# Patient Record
Sex: Male | Born: 1965 | Race: White | Hispanic: No | Marital: Married | Smoking: Current some day smoker
Health system: Southern US, Community
[De-identification: ages and names within clinical notes are randomized; demographics above are authoritative.]

## PROBLEM LIST (undated history)

## (undated) DIAGNOSIS — E669 Obesity, unspecified: Secondary | ICD-10-CM

## (undated) DIAGNOSIS — E7800 Pure hypercholesterolemia, unspecified: Secondary | ICD-10-CM

## (undated) DIAGNOSIS — F4310 Post-traumatic stress disorder, unspecified: Secondary | ICD-10-CM

## (undated) DIAGNOSIS — I10 Essential (primary) hypertension: Secondary | ICD-10-CM

---

## 2005-02-21 ENCOUNTER — Ambulatory Visit (HOSPITAL_COMMUNITY): Admission: RE | Admit: 2005-02-21 | Discharge: 2005-02-21 | Payer: Self-pay | Admitting: Family Medicine

## 2005-03-20 ENCOUNTER — Ambulatory Visit: Payer: Self-pay | Admitting: Internal Medicine

## 2005-03-25 ENCOUNTER — Ambulatory Visit: Payer: Self-pay | Admitting: Internal Medicine

## 2005-03-25 ENCOUNTER — Ambulatory Visit (HOSPITAL_COMMUNITY): Admission: RE | Admit: 2005-03-25 | Discharge: 2005-03-25 | Payer: Self-pay | Admitting: Internal Medicine

## 2005-04-26 ENCOUNTER — Ambulatory Visit: Payer: Self-pay | Admitting: Internal Medicine

## 2005-08-02 ENCOUNTER — Emergency Department (HOSPITAL_COMMUNITY): Admission: EM | Admit: 2005-08-02 | Discharge: 2005-08-02 | Payer: Self-pay | Admitting: Emergency Medicine

## 2005-08-02 ENCOUNTER — Ambulatory Visit: Payer: Self-pay | Admitting: Psychiatry

## 2005-08-02 ENCOUNTER — Inpatient Hospital Stay (HOSPITAL_COMMUNITY): Admission: RE | Admit: 2005-08-02 | Discharge: 2005-08-13 | Payer: Self-pay | Admitting: Psychiatry

## 2005-08-09 ENCOUNTER — Ambulatory Visit (HOSPITAL_COMMUNITY): Admission: RE | Admit: 2005-08-09 | Discharge: 2005-08-09 | Payer: Self-pay | Admitting: Psychiatry

## 2005-09-05 ENCOUNTER — Ambulatory Visit: Payer: Self-pay | Admitting: Psychiatry

## 2005-09-09 ENCOUNTER — Emergency Department (HOSPITAL_COMMUNITY): Admission: EM | Admit: 2005-09-09 | Discharge: 2005-09-10 | Payer: Self-pay | Admitting: Emergency Medicine

## 2005-09-23 ENCOUNTER — Ambulatory Visit (HOSPITAL_COMMUNITY): Admission: RE | Admit: 2005-09-23 | Discharge: 2005-09-23 | Payer: Self-pay | Admitting: Family Medicine

## 2005-10-01 ENCOUNTER — Ambulatory Visit (HOSPITAL_COMMUNITY): Admission: RE | Admit: 2005-10-01 | Discharge: 2005-10-01 | Payer: Self-pay | Admitting: Otolaryngology

## 2005-10-03 ENCOUNTER — Encounter: Payer: Self-pay | Admitting: Otolaryngology

## 2005-10-03 ENCOUNTER — Encounter: Admission: RE | Admit: 2005-10-03 | Discharge: 2005-10-03 | Payer: Self-pay | Admitting: Otolaryngology

## 2005-10-04 ENCOUNTER — Ambulatory Visit (HOSPITAL_COMMUNITY): Admission: RE | Admit: 2005-10-04 | Discharge: 2005-10-04 | Payer: Self-pay | Admitting: Otolaryngology

## 2005-10-08 ENCOUNTER — Ambulatory Visit (HOSPITAL_COMMUNITY): Admission: RE | Admit: 2005-10-08 | Discharge: 2005-10-09 | Payer: Self-pay | Admitting: Otolaryngology

## 2005-10-31 ENCOUNTER — Ambulatory Visit: Payer: Self-pay | Admitting: Psychiatry

## 2005-11-08 ENCOUNTER — Ambulatory Visit (HOSPITAL_COMMUNITY): Admission: RE | Admit: 2005-11-08 | Discharge: 2005-11-08 | Payer: Self-pay | Admitting: Otolaryngology

## 2005-11-11 ENCOUNTER — Emergency Department (HOSPITAL_COMMUNITY): Admission: EM | Admit: 2005-11-11 | Discharge: 2005-11-11 | Payer: Self-pay | Admitting: Emergency Medicine

## 2005-11-24 ENCOUNTER — Inpatient Hospital Stay (HOSPITAL_COMMUNITY): Admission: RE | Admit: 2005-11-24 | Discharge: 2005-12-10 | Payer: Self-pay | Admitting: Psychiatry

## 2005-11-28 ENCOUNTER — Encounter: Payer: Self-pay | Admitting: Emergency Medicine

## 2005-12-31 ENCOUNTER — Ambulatory Visit: Payer: Self-pay | Admitting: Psychiatry

## 2006-01-30 ENCOUNTER — Ambulatory Visit (HOSPITAL_COMMUNITY): Admission: RE | Admit: 2006-01-30 | Discharge: 2006-01-30 | Payer: Self-pay | Admitting: Family Medicine

## 2006-02-11 ENCOUNTER — Emergency Department (HOSPITAL_COMMUNITY): Admission: EM | Admit: 2006-02-11 | Discharge: 2006-02-11 | Payer: Self-pay | Admitting: Emergency Medicine

## 2006-02-12 ENCOUNTER — Encounter: Payer: Self-pay | Admitting: Emergency Medicine

## 2006-02-12 ENCOUNTER — Inpatient Hospital Stay (HOSPITAL_COMMUNITY): Admission: RE | Admit: 2006-02-12 | Discharge: 2006-02-14 | Payer: Self-pay | Admitting: Psychiatry

## 2006-02-13 ENCOUNTER — Ambulatory Visit: Payer: Self-pay | Admitting: Psychiatry

## 2006-03-03 ENCOUNTER — Emergency Department (HOSPITAL_COMMUNITY): Admission: EM | Admit: 2006-03-03 | Discharge: 2006-03-03 | Payer: Self-pay | Admitting: Emergency Medicine
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
## 2006-10-12 IMAGING — CT CT MAXILLOFACIAL W/O CM
1 series · 16 of 30 positions shown, 20 images · IV contrast (agent unspecified)
Comparison: 10/03/05.

CLINICAL DATA: Facial pain and sinusitis ? for surgery.  Stealth protocol.
 MAXILLOFACIAL CT WITHOUT CONTRAST:
TECHNIQUE: Coronal and axial CT images were obtained through the maxillofacial region including the facial bones, orbits, and paranasal sinuses.  No intravenous contrast was administered.  1.25 mm images obtained in the axial plane with Stealth protocol.

[Series 4: stealth · axial · 0.49mm/px · z∈[+80,+265]mm · 16 of 160 slices shown, 20 images]
[im 6/160  brain]
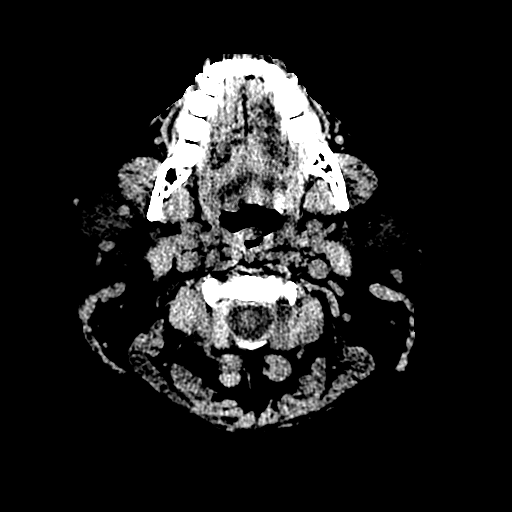
[im 6/160  bone]
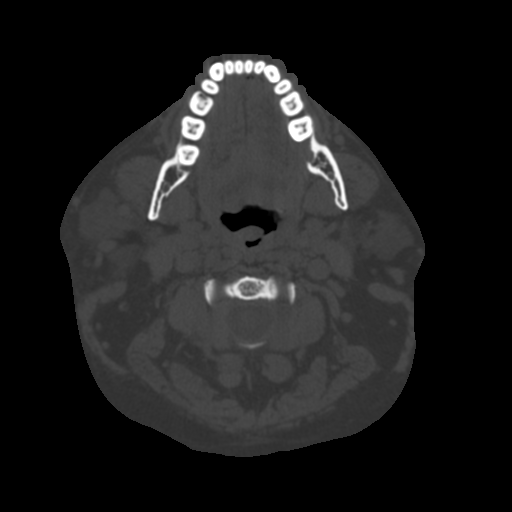
[im 17/160  bone]
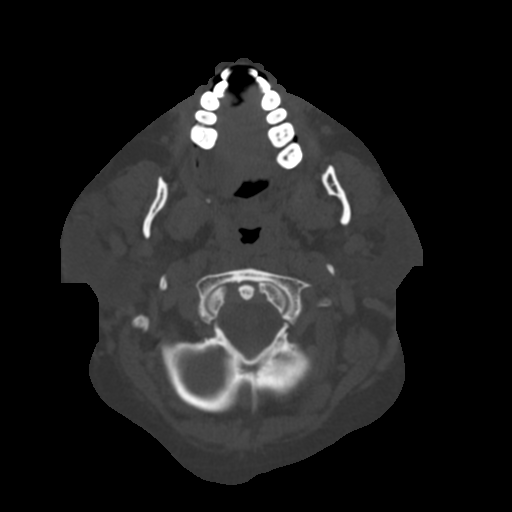
[im 28/160  bone]
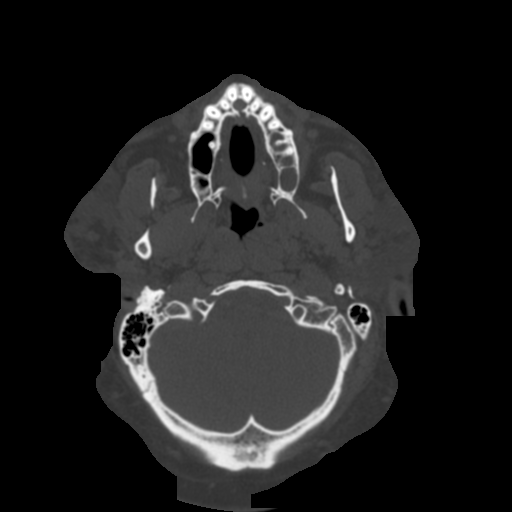
[im 39/160  bone]
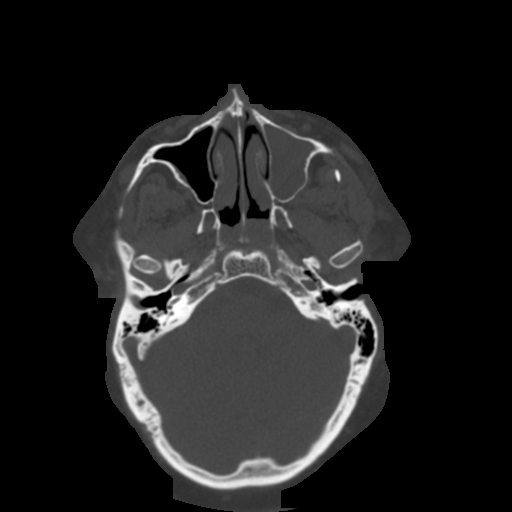
[im 44/160  brain]
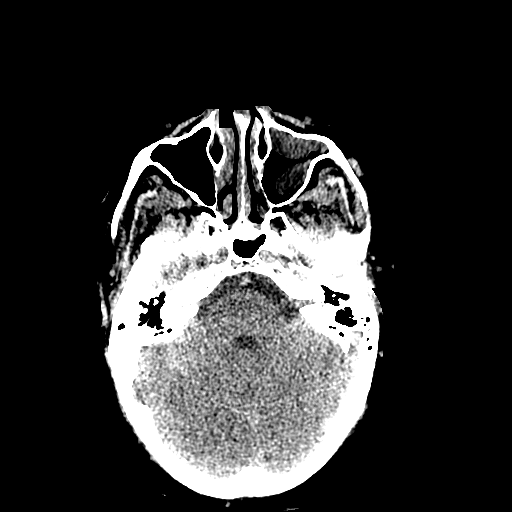
[im 44/160  bone]
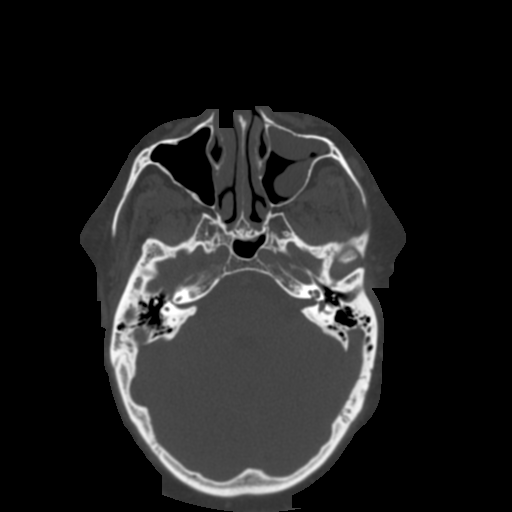
[im 55/160  bone]
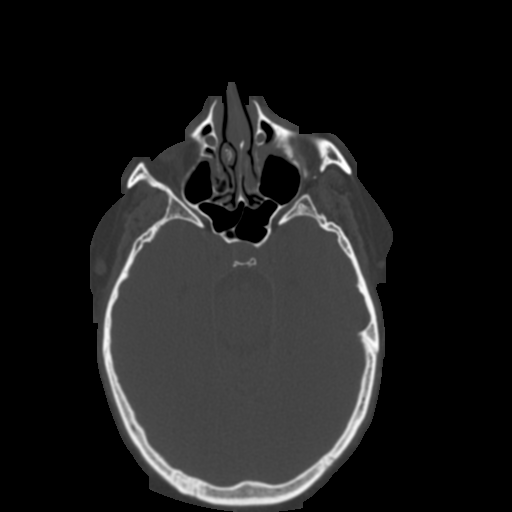
[im 66/160  bone]
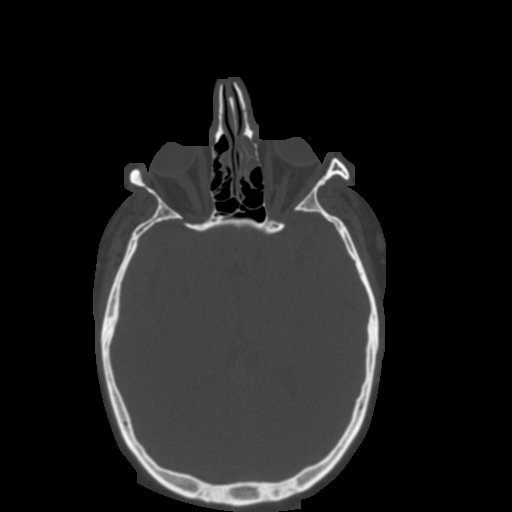
[im 77/160  bone]
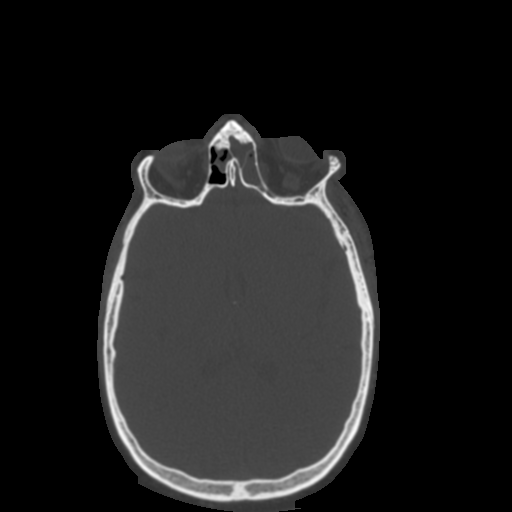
[im 83/160  brain]
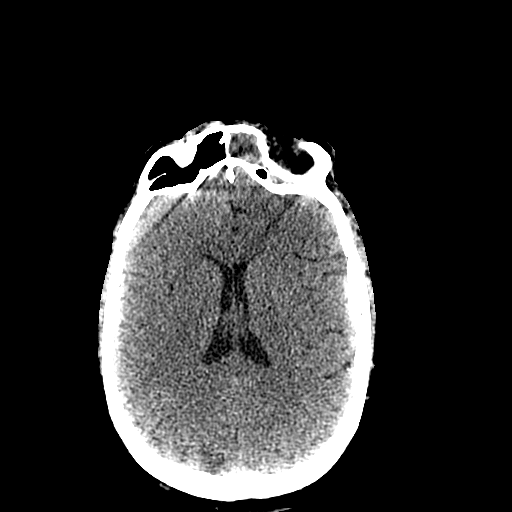
[im 83/160  bone]
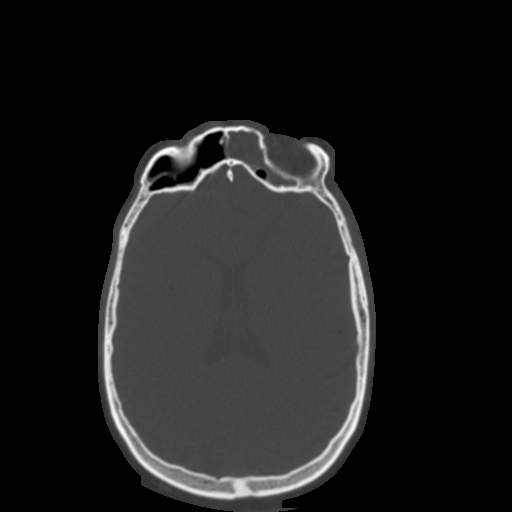
[im 94/160  bone]
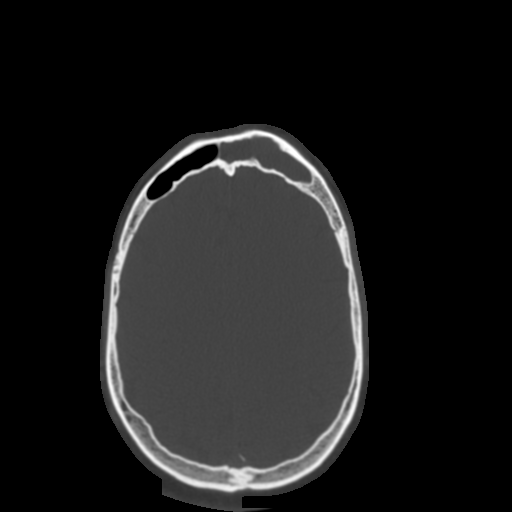
[im 105/160  bone]
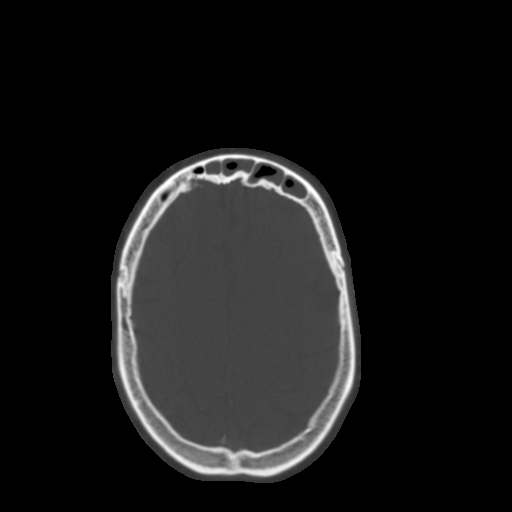
[im 116/160  bone]
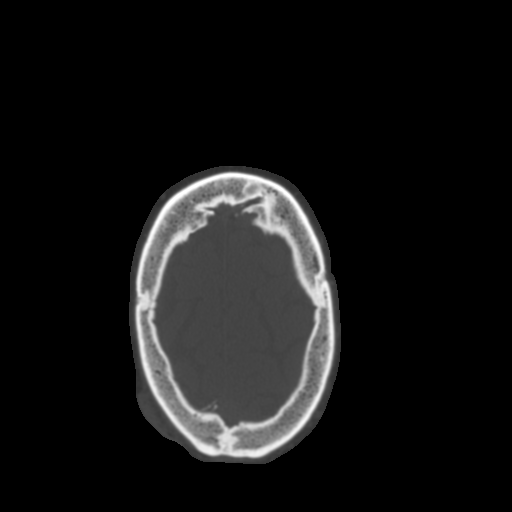
[im 121/160  brain]
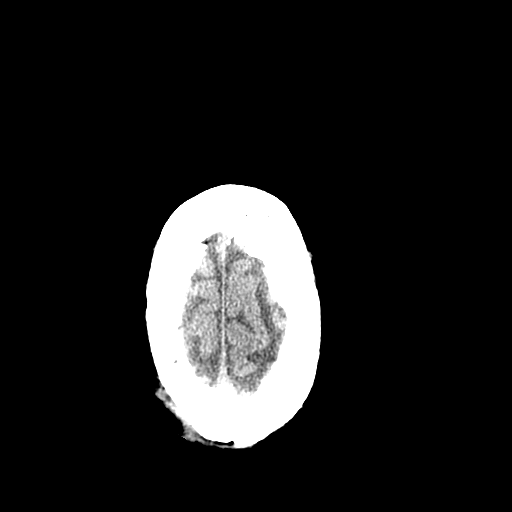
[im 121/160  bone]
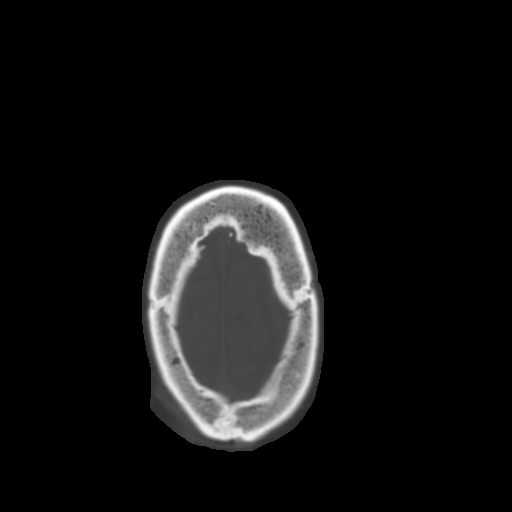
[im 132/160  bone]
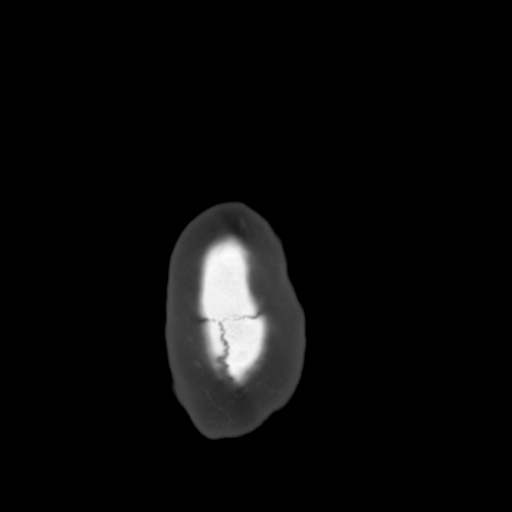
[im 143/160  bone]
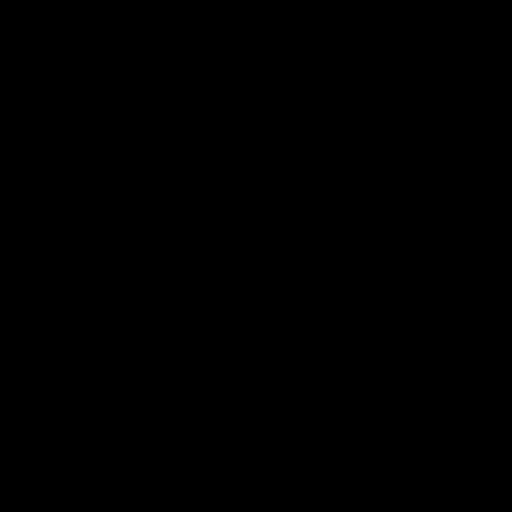
[im 154/160  bone]
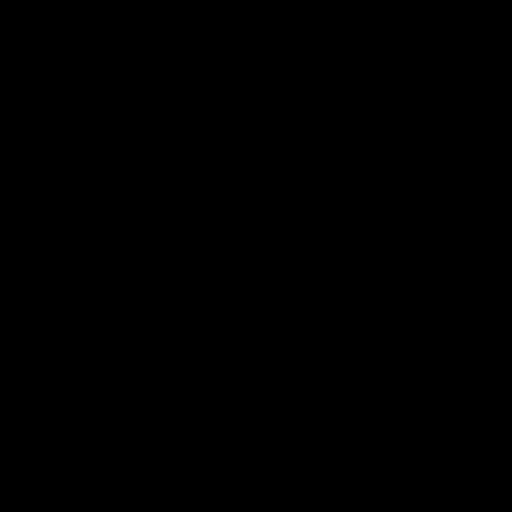

[16 of 30 positions shown; findings below may reference images not displayed]

FINDINGS: Changes of sinusitis are essentially unchanged from yesterday?s exam, with almost complete opacification of the left frontal sinus.  Bilateral ethmoid disease and left maxillary disease appear unchanged.  The sphenoid sinuses appear normally aerated.
IMPRESSION: Sinusitis as described above ? no significant change compared to 10/03/05.

## 2023-02-17 ENCOUNTER — Emergency Department (HOSPITAL_COMMUNITY): Payer: 59

## 2023-02-17 ENCOUNTER — Encounter (HOSPITAL_COMMUNITY): Payer: Self-pay | Admitting: Emergency Medicine

## 2023-02-17 ENCOUNTER — Emergency Department (HOSPITAL_COMMUNITY)
Admission: EM | Admit: 2023-02-17 | Discharge: 2023-02-17 | Disposition: A | Discharge status: Home / Self Care | Payer: 59 | Attending: Emergency Medicine | Admitting: Emergency Medicine

## 2023-02-17 ENCOUNTER — Other Ambulatory Visit: Payer: Self-pay

## 2023-02-17 DIAGNOSIS — R0789 Other chest pain: Secondary | ICD-10-CM | POA: Insufficient documentation

## 2023-02-17 DIAGNOSIS — I10 Essential (primary) hypertension: Secondary | ICD-10-CM | POA: Insufficient documentation

## 2023-02-17 HISTORY — DX: Pure hypercholesterolemia, unspecified: E78.00

## 2023-02-17 HISTORY — DX: Essential (primary) hypertension: I10

## 2023-02-17 HISTORY — DX: Post-traumatic stress disorder, unspecified: F43.10

## 2023-02-17 LAB — CBC
HCT: 33.9 % — ABNORMAL LOW (ref 39.0–52.0)
Hemoglobin: 11.4 g/dL — ABNORMAL LOW (ref 13.0–17.0)
MCH: 32 pg (ref 26.0–34.0)
MCHC: 33.6 g/dL (ref 30.0–36.0)
MCV: 95.2 fL (ref 80.0–100.0)
Platelets: 160 10*3/uL (ref 150–400)
RBC: 3.56 MIL/uL — ABNORMAL LOW (ref 4.22–5.81)
RDW: 13.3 % (ref 11.5–15.5)
WBC: 3.9 10*3/uL — ABNORMAL LOW (ref 4.0–10.5)
nRBC: 0 % (ref 0.0–0.2)

## 2023-02-17 LAB — TROPONIN I (HIGH SENSITIVITY)
Troponin I (High Sensitivity): 6 ng/L (ref <18)
Troponin I (High Sensitivity): 7 ng/L (ref <18)

## 2023-02-17 LAB — COMPREHENSIVE METABOLIC PANEL
ALT: 38 U/L (ref 0–44)
AST: 33 U/L (ref 15–41)
Albumin: 3.6 g/dL (ref 3.5–5.0)
Alkaline Phosphatase: 86 U/L (ref 38–126)
Anion gap: 8 (ref 5–15)
BUN: 16 mg/dL (ref 6–20)
CO2: 24 mmol/L (ref 22–32)
Calcium: 8.5 mg/dL — ABNORMAL LOW (ref 8.9–10.3)
Chloride: 106 mmol/L (ref 98–111)
Creatinine, Ser: 0.95 mg/dL (ref 0.61–1.24)
GFR, Estimated: >60 mL/min (ref >60)
Glucose, Bld: 139 mg/dL — ABNORMAL HIGH (ref 70–99)
Potassium: 3.9 mmol/L (ref 3.5–5.1)
Sodium: 138 mmol/L (ref 135–145)
Total Bilirubin: 0.4 mg/dL (ref 0.3–1.2)
Total Protein: 6.2 g/dL — ABNORMAL LOW (ref 6.5–8.1)

## 2023-02-17 LAB — LIPASE, BLOOD: Lipase: 48 U/L (ref 11–51)

## 2023-02-17 MED ORDER — HYDROCODONE-ACETAMINOPHEN 5-325 MG PO TABS: 2.0000 | ORAL_TABLET | ORAL | 0 refills | Status: DC | PRN

## 2023-02-17 MED ORDER — IOHEXOL 350 MG/ML SOLN
100.0000 mL | Freq: Once | INTRAVENOUS | Status: AC | PRN
  Administered 2023-02-17: 80 mL via INTRAVENOUS

## 2023-02-17 MED ORDER — LORAZEPAM 0.5 MG PO TABS
0.5000 mg | ORAL_TABLET | Freq: Once | ORAL | Status: DC
  Filled 2023-02-17: qty 1

## 2023-02-17 MED ORDER — HYDROMORPHONE HCL 1 MG/ML IJ SOLN
1.0000 mg | Freq: Once | INTRAMUSCULAR | Status: AC
  Administered 2023-02-17: 1 mg via INTRAVENOUS
  Filled 2023-02-17: qty 1

## 2023-02-17 MED ORDER — KETOROLAC TROMETHAMINE 30 MG/ML IJ SOLN
30.0000 mg | Freq: Once | INTRAMUSCULAR | Status: AC
  Administered 2023-02-17: 30 mg via INTRAVENOUS
  Filled 2023-02-17: qty 1

## 2023-02-17 MED ORDER — LORAZEPAM 1 MG PO TABS
1.0000 mg | ORAL_TABLET | Freq: Once | ORAL | Status: AC
  Administered 2023-02-17: 1 mg via ORAL
  Filled 2023-02-17: qty 1
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
## 2023-02-17 NOTE — Discharge Instructions (Addendum)
You were seen in the emergency department today for chest pain.  Thankfully all of your labs and imaging were reassuring without any signs of any rib fractures, cardiac disease, or pulmonary embolisms present.  Although there is no signs of rib fractures, I am going to approach the treatment of this as if it were a rib fracture and sent home with an incentive spirometer as well as some short-term pain control medication to hopefully ease the discomfort and reduce the risk of any worsening of your pain.  Please come back to the emergency department through breathing becomes significantly more labored or if you begin to develop a fever.

## 2023-02-17 NOTE — ED Triage Notes (Signed)
Reports about 3 weeks ago was lifting unbalanced things and started having right sided chest pain.  Patient reports over last 4 days it has got worse and it wraps around upper abd and in to back.  Reports pain with inspiration.

## 2023-02-25 ENCOUNTER — Other Ambulatory Visit: Payer: Self-pay

## 2023-02-25 ENCOUNTER — Encounter (HOSPITAL_COMMUNITY): Payer: Self-pay

## 2023-02-25 ENCOUNTER — Observation Stay (HOSPITAL_COMMUNITY)
Admission: EM | Admit: 2023-02-25 | Discharge: 2023-02-27 | Disposition: A | Discharge status: Home / Self Care | Payer: 59 | Attending: Family Medicine | Admitting: Family Medicine

## 2023-02-25 ENCOUNTER — Emergency Department (HOSPITAL_COMMUNITY): Payer: 59

## 2023-02-25 DIAGNOSIS — R739 Hyperglycemia, unspecified: Secondary | ICD-10-CM | POA: Diagnosis not present

## 2023-02-25 DIAGNOSIS — R9431 Abnormal electrocardiogram [ECG] [EKG]: Secondary | ICD-10-CM | POA: Diagnosis not present

## 2023-02-25 DIAGNOSIS — I480 Paroxysmal atrial fibrillation: Secondary | ICD-10-CM | POA: Diagnosis present

## 2023-02-25 DIAGNOSIS — Z79899 Other long term (current) drug therapy: Secondary | ICD-10-CM | POA: Insufficient documentation

## 2023-02-25 DIAGNOSIS — E785 Hyperlipidemia, unspecified: Secondary | ICD-10-CM | POA: Diagnosis not present

## 2023-02-25 DIAGNOSIS — Z7982 Long term (current) use of aspirin: Secondary | ICD-10-CM | POA: Insufficient documentation

## 2023-02-25 DIAGNOSIS — F17210 Nicotine dependence, cigarettes, uncomplicated: Secondary | ICD-10-CM | POA: Diagnosis not present

## 2023-02-25 DIAGNOSIS — R079 Chest pain, unspecified: Secondary | ICD-10-CM | POA: Diagnosis present

## 2023-02-25 DIAGNOSIS — F4310 Post-traumatic stress disorder, unspecified: Secondary | ICD-10-CM | POA: Insufficient documentation

## 2023-02-25 DIAGNOSIS — R0602 Shortness of breath: Secondary | ICD-10-CM | POA: Insufficient documentation

## 2023-02-25 DIAGNOSIS — R0789 Other chest pain: Secondary | ICD-10-CM | POA: Diagnosis not present

## 2023-02-25 DIAGNOSIS — I10 Essential (primary) hypertension: Secondary | ICD-10-CM | POA: Diagnosis not present

## 2023-02-25 DIAGNOSIS — G8929 Other chronic pain: Secondary | ICD-10-CM | POA: Diagnosis not present

## 2023-02-25 LAB — URINALYSIS, ROUTINE W REFLEX MICROSCOPIC
Bilirubin Urine: NEGATIVE
Glucose, UA: NEGATIVE mg/dL
Hgb urine dipstick: NEGATIVE
Ketones, ur: NEGATIVE mg/dL
Leukocytes,Ua: NEGATIVE
Nitrite: NEGATIVE
Protein, ur: 30 mg/dL — AB
Specific Gravity, Urine: 1.023 (ref 1.005–1.030)
pH: 5 (ref 5.0–8.0)

## 2023-02-25 LAB — CBC
HCT: 39.3 % (ref 39.0–52.0)
Hemoglobin: 13 g/dL (ref 13.0–17.0)
MCH: 31.9 pg (ref 26.0–34.0)
MCHC: 33.1 g/dL (ref 30.0–36.0)
MCV: 96.3 fL (ref 80.0–100.0)
Platelets: 192 10*3/uL (ref 150–400)
RBC: 4.08 MIL/uL — ABNORMAL LOW (ref 4.22–5.81)
RDW: 13.6 % (ref 11.5–15.5)
WBC: 5.1 10*3/uL (ref 4.0–10.5)
nRBC: 0 % (ref 0.0–0.2)

## 2023-02-25 LAB — BASIC METABOLIC PANEL
Anion gap: 11 (ref 5–15)
BUN: 14 mg/dL (ref 6–20)
CO2: 26 mmol/L (ref 22–32)
Calcium: 9.1 mg/dL (ref 8.9–10.3)
Chloride: 96 mmol/L — ABNORMAL LOW (ref 98–111)
Creatinine, Ser: 1 mg/dL (ref 0.61–1.24)
GFR, Estimated: >60 mL/min (ref >60)
Glucose, Bld: 241 mg/dL — ABNORMAL HIGH (ref 70–99)
Potassium: 4.1 mmol/L (ref 3.5–5.1)
Sodium: 133 mmol/L — ABNORMAL LOW (ref 135–145)

## 2023-02-25 LAB — BRAIN NATRIURETIC PEPTIDE: B Natriuretic Peptide: 19 pg/mL (ref 0.0–100.0)

## 2023-02-25 LAB — TROPONIN I (HIGH SENSITIVITY)
Troponin I (High Sensitivity): 7 ng/L (ref <18)
Troponin I (High Sensitivity): 6 ng/L (ref <18)

## 2023-02-25 MED ORDER — LURASIDONE HCL 40 MG PO TABS
20.0000 mg | ORAL_TABLET | Freq: Every day | ORAL | Status: DC
  Administered 2023-02-26 – 2023-02-27 (×2): 20 mg via ORAL
  Filled 2023-02-25 (×2): qty 1

## 2023-02-25 MED ORDER — AMLODIPINE BESYLATE 5 MG PO TABS
5.0000 mg | ORAL_TABLET | Freq: Every day | ORAL | Status: DC
  Administered 2023-02-26 – 2023-02-27 (×2): 5 mg via ORAL
  Filled 2023-02-25 (×2): qty 1

## 2023-02-25 MED ORDER — HYDROMORPHONE HCL 1 MG/ML IJ SOLN
1.0000 mg | Freq: Once | INTRAMUSCULAR | Status: AC
  Administered 2023-02-25: 1 mg via INTRAVENOUS
  Filled 2023-02-25: qty 1

## 2023-02-25 MED ORDER — HYDRALAZINE HCL 20 MG/ML IJ SOLN
10.0000 mg | Freq: Once | INTRAMUSCULAR | Status: AC
  Administered 2023-02-25: 10 mg via INTRAVENOUS
  Filled 2023-02-25: qty 1

## 2023-02-25 MED ORDER — ASPIRIN 81 MG PO CHEW
324.0000 mg | CHEWABLE_TABLET | Freq: Once | ORAL | Status: AC
  Administered 2023-02-25: 324 mg via ORAL
  Filled 2023-02-25: qty 4

## 2023-02-25 MED ORDER — ROSUVASTATIN CALCIUM 10 MG PO TABS
10.0000 mg | ORAL_TABLET | Freq: Every day | ORAL | Status: DC
  Administered 2023-02-26 – 2023-02-27 (×2): 10 mg via ORAL
  Filled 2023-02-25 (×2): qty 1

## 2023-02-25 MED ORDER — HEPARIN SODIUM (PORCINE) 5000 UNIT/ML IJ SOLN
5000.0000 [IU] | Freq: Three times a day (TID) | INTRAMUSCULAR | Status: DC
  Administered 2023-02-25 – 2023-02-27 (×5): 5000 [IU] via SUBCUTANEOUS
  Filled 2023-02-25 (×5): qty 1

## 2023-02-25 MED ORDER — KETOROLAC TROMETHAMINE 15 MG/ML IJ SOLN
15.0000 mg | Freq: Once | INTRAMUSCULAR | Status: AC
  Administered 2023-02-25: 15 mg via INTRAVENOUS
  Filled 2023-02-25: qty 1

## 2023-02-25 MED ORDER — VENLAFAXINE HCL ER 75 MG PO CP24
150.0000 mg | ORAL_CAPSULE | Freq: Every day | ORAL | Status: DC
  Administered 2023-02-26 – 2023-02-27 (×2): 150 mg via ORAL
  Filled 2023-02-25 (×2): qty 2

## 2023-02-25 MED ORDER — ACETAMINOPHEN 325 MG PO TABS
650.0000 mg | ORAL_TABLET | ORAL | Status: DC | PRN
  Administered 2023-02-26: 650 mg via ORAL
  Filled 2023-02-25: qty 2

## 2023-02-25 MED ORDER — GABAPENTIN 100 MG PO CAPS
200.0000 mg | ORAL_CAPSULE | Freq: Two times a day (BID) | ORAL | Status: DC
  Administered 2023-02-25 – 2023-02-26 (×3): 200 mg via ORAL
  Filled 2023-02-25 (×3): qty 2

## 2023-02-25 MED ORDER — ONDANSETRON HCL 4 MG/2ML IJ SOLN: 4.0000 mg | Freq: Four times a day (QID) | INTRAMUSCULAR | Status: DC | PRN

## 2023-02-25 MED ORDER — MORPHINE SULFATE (PF) 4 MG/ML IV SOLN
4.0000 mg | INTRAVENOUS | Status: DC | PRN
  Administered 2023-02-25 – 2023-02-27 (×10): 4 mg via INTRAVENOUS
  Filled 2023-02-25 (×10): qty 1

## 2023-02-25 NOTE — ED Notes (Signed)
ED Provider at bedside. 

## 2023-02-25 NOTE — H&P (Signed)
History and Physical    Patient: Chad Lester [id number] DOB: Jan 04, 1966 DOA: 02/25/2023 DOS: the patient was seen and examined on 02/25/2023 PCP: Pcp, No  Patient coming from: Home  Chief Complaint:  Chief Complaint  Patient presents with   Chest Pain   HPI: Chad Lester is a 57 y.o. male with medical history significant of hyperlipidemia, hypertension, PTSD, mood disorder, presents the ED with a chief complaint of chest pain.  Patient reports that the pain feels like torn muscles.  He reports it feels like a broken rib only more intense.  It started 4 weeks ago.  He was loading a truck with heavy boxes when he became unbalanced and tried to catch himself.  The pain has been present since then.  He took a few days of rest but the pain continued to get worse.  At that time it was moving from the right all the way over to the left.  1 week ago he was seen in the ER and given hydrocodone.  His pain on the right started to improve, but the pain on the left continue to get worse.  Any exertion makes it worse.  The pain "takes his breath away. "  It is still there at rest sometimes.  It wakes him out of sleep sometimes.  He has associated palpitations but reports that he has a history of A-fib, and occasionally gets palpitations.  He reports he had ablation years ago.  He has not had to be on a blood thinner for atrial fibrillation in 7 years per his report.  Patient has never had a stress test.  He has not seen a cardiologist in years.  He feels anxious and wants Dilaudid to help with that.  He has been informed that I do not start without start on narcotics and he has a pain scale that includes Tylenol, oxycodone, and morphine.  Patient reports he has a history of DVT.  It was provoked from a drive from [place] to [place].  He has recently been making multiple trips from [place] to [place].  At his last visit to the ER he had a CTA that was negative for pulmonary embolus.  He has  not made any long drive since then.  Patient reports his dyspnea on exertion has been present for about a year.  It does seem worse with this pain.  Patient has no other complaints.  Patient vapes, and occasionally smokes marijuana.  He does not drink alcohol.  He is vaccinated for COVID and flu.  Patient is full code. Review of Systems: As mentioned in the history of present illness. All other systems reviewed and are negative. Past Medical History:  Diagnosis Date   High cholesterol    Hypertension    PTSD (post-traumatic stress disorder)    History reviewed. No pertinent surgical history. Social History:  reports that he has been smoking cigarettes. He has never used smokeless tobacco. He reports that he does not currently use alcohol. He reports current drug use. Drug: Marijuana.  Allergies  Allergen Reactions   Bentyl [Dicyclomine]    Lisinopril    Penicillins    Sulfa Antibiotics     History reviewed. No pertinent family history.  Prior to Admission medications   Medication Sig Start Date End Date Taking? Authorizing Provider  acetaminophen (TYLENOL) 500 MG tablet Take 500 mg by mouth every 6 (six) hours as needed.   Yes [provider]  amLODipine (NORVASC) 5 MG tablet Take 1  tablet by mouth daily. 05/20/22  Yes [provider]  Aspirin-Salicylamide-Caffeine (ARTHRITIS STRENGTH BC POWDER PO) Take 2 packets by mouth as needed (pain).   Yes [provider]  cyclobenzaprine (FLEXERIL) 10 MG tablet Take 10 mg by mouth as needed for muscle spasms.   Yes [provider]  gabapentin (NEURONTIN) 100 MG capsule Take 2 capsules by mouth 2 (two) times daily.   Yes [provider]  HYDROcodone-acetaminophen (NORCO/VICODIN) 5-325 MG tablet Take 2 tablets by mouth every 4 (four) hours as needed. 02/17/23  Yes Luvenia Heller, PA-C  ibuprofen (ADVIL) 200 MG tablet Take 800 mg by mouth every 6 (six) hours as needed for moderate pain.   Yes [provider]  lurasidone (LATUDA) 20 MG TABS tablet Take 1 tablet by mouth daily.   Yes [provider]  rosuvastatin (CRESTOR) 10 MG tablet Take 10 mg by mouth daily.   Yes [provider]  venlafaxine XR (EFFEXOR-XR) 150 MG 24 hr capsule Take 1 capsule by mouth daily.   Yes [provider]  EPINEPHrine (EPIPEN 2-PAK) 0.3 mg/0.3 mL IJ SOAJ injection Take 1 auto by injection route as directed. Patient not taking: Reported on 02/25/2023 02/08/22   [provider]    Physical Exam: Vitals:   02/25/23 1930 02/25/23 1938 02/25/23 1939 02/25/23 2031  BP:  (!) 139/99  90/78  Pulse: 81 94 91 90  Resp: '16 16 18 20  '$ Temp:    97.7 F (36.5 C)  TempSrc:    Oral  SpO2: 100% 92% 99% 98%  Weight:      Height:       1.  General: Patient lying supine in bed,  no acute distress   2. Psychiatric: Alert and oriented x 3, mood and behavior normal for situation, pleasant and cooperative with exam   3. Neurologic: Speech and language are normal, face is symmetric, moves all 4 extremities voluntarily, at baseline without acute deficits on limited exam   4. HEENMT:  Head is atraumatic, normocephalic, pupils reactive to light, neck is supple, trachea is midline, mucous membranes are moist   5. Respiratory : Lungs are clear to auscultation bilaterally without wheezing, rhonchi, rales, no cyanosis, no increase in work of breathing or accessory muscle use   6. Cardiovascular : Heart rate normal, rhythm is regular, no murmurs, rubs or gallops, no peripheral edema, peripheral pulses palpated   7. Gastrointestinal:  Abdomen is soft, nondistended, nontender to palpation bowel sounds active, no masses or organomegaly palpated   8. Skin:  Skin is warm, dry and intact without rashes, acute lesions, or ulcers on limited exam   9.Musculoskeletal:  No acute deformities or trauma, no asymmetry in tone, no peripheral edema, peripheral pulses palpated, no tenderness to  palpation in the extremities  Data Reviewed: In the ED Temp 98.3, heart rate 81-93, respiratory rate 18-22, blood pressure 126/72-157/110, satting 98% No leukocytosis, hemoglobin 13.0, platelets 192 Chemistry is unremarkable Glucose elevated 241 Chest x-ray shows cardiomegaly without acute abnormality of the lungs EKG shows new Q waves in the inferior leads Cardiology consulted recommended admission for cardiology to round on in the a.m. and possibly for stress test Troponin normal x 2 Admission requested for chest pain observation  Assessment and Plan: * Chest pain - Chest pain wrapping all the way around chest, below the level of T4, with associated dyspnea - Troponin 7, 6 - Patient has been seen several times in the ER for the same complaint, and the  comparison of EKG shows new Q waves - Cardiology consulted recommended admission for cardiology to round on in the a.m. and possibly do stress test - Monitor on telemetry - Trend troponin in the a.m. again - Chest x-ray showed cardiomegaly without acute abnormality - Patient was given aspirin - Defer further cardiac studies to cardiology - Continue to monitor  Hyperglycemia No reported history of diabetes Check hemoglobin A1c in the a.m.  PTSD (post-traumatic stress disorder) - Continue Effexor  Hyperlipidemia - Continue Crestor  Chronic pain - Continue gabapentin 200 mg twice daily  Essential hypertension - Continue Norvasc 5 mg - 1 dose of hydralazine 10 mg IV was also given in the ED for your diastolic blood pressure [id number]      Advance Care Planning:   Code Status: Full Code  Consults: Cardiology  Family Communication: Cousin at bedside  Severity of Illness: The appropriate patient status for this patient is OBSERVATION. Observation status is judged to be reasonable and necessary in order to provide the required intensity of service to ensure the patient's safety. The patient's presenting symptoms, physical exam  findings, and initial radiographic and laboratory data in the context of their medical condition is felt to place them at decreased risk for further clinical deterioration. Furthermore, it is anticipated that the patient will be medically stable for discharge from the hospital within 2 midnights of admission.   Author: Rolla Plate, DO 02/25/2023 8:54 PM  For on call review [email or web address].

## 2023-02-25 NOTE — Assessment & Plan Note (Signed)
-   Continue Norvasc 5 mg - 1 dose of hydralazine 10 mg IV was also given in the ED for your diastolic blood pressure [id number] -Blood pressure stable

## 2023-02-25 NOTE — Assessment & Plan Note (Signed)
-   Continue Effexor

## 2023-02-25 NOTE — Assessment & Plan Note (Signed)
No reported history of diabetes Check hemoglobin A1c in the a.m.

## 2023-02-25 NOTE — Assessment & Plan Note (Signed)
-   Continue gabapentin 200 mg twice daily -Factorial chest pain, likely musculoskeletal -Will consider anti-inflammatories including Robaxin muscle relaxants added

## 2023-02-25 NOTE — ED Notes (Signed)
Called to give report, was told by secretary that they are not taking anyone at this moment.  ED CN notified

## 2023-02-25 NOTE — ED Provider Triage Note (Signed)
Emergency Medicine Provider Triage Evaluation Note  Chad Lester , a 57 y.o. male  was evaluated in triage.  Pt complains of left sided chest pain Pt reports pain takes his breath away   Review of Systems  Positive: Pain with exertion  Negative: fever  Physical Exam  BP (!) 157/106 (BP Location: Right Arm)   Pulse 93   Temp 98.3 F (36.8 C) (Oral)   Resp (!) 22   Ht '5\' 11"'$  (1.803 m)   Wt (!) 154.2 kg   SpO2 97%   BMI 47.42 kg/m  Gen:   Awake, no distress   Resp:  Normal effort  MSK:   Moves extremities without difficulty  Other:    Medical Decision Making  Medically screening exam initiated at 2:16 PM.  Appropriate orders placed.  Chad Lester was informed that the remainder of the evaluation will be completed by another provider, this initial triage assessment does not replace that evaluation, and the importance of remaining in the ED until their evaluation is complete.     Chad Lester, [place] 02/25/23 1418

## 2023-02-25 NOTE — ED Triage Notes (Addendum)
Reports left chest/shoulder pain that is taking his breath away. Patient was seen here on the 19th for chest pain all across his chest and had CTA which was negative.  Patient returns this morning with similar symptoms from previous that have still been going on but just has gotten worse.  Symptoms x 5 weeks. Reports if he lifts his left arm it feels better or if he puts a towel under his armpit the burning gets better.

## 2023-02-25 NOTE — Assessment & Plan Note (Signed)
Continue Crestor 

## 2023-02-25 NOTE — Assessment & Plan Note (Addendum)
-   Atypical chest pain - Left-sided pectoralis - chest tenderness - Chest pain wrapping all the way around chest, below the level of T4, with associated dyspnea - Troponin 7, 6, 10, 9 - Patient has been seen several times in the ER for the same complaint, and the comparison of EKG shows ? new Q waves - Cardiology consulted, evaluated the patient, pending 2D echocardiogram After echo normal can be discharged home, if abnormal consider cardiac cath   - Monitor on telemetry - Trend troponin in the a.m. again - Chest x-ray showed cardiomegaly without acute abnormality - Patient was given aspirin - Defer further cardiac studies to cardiology - Continue to monitor

## 2023-02-25 NOTE — ED Provider Notes (Signed)
[facility] Provider Note   CSN: [id number] Arrival date & time: 02/25/23  1316     History  Chief Complaint  Patient presents with   Chest Pain    Chad Lester is a 57 y.o. male.  HPI 57 year old male with a history of HTN as well as some mild hypercholesterolemia presents with chest pain.  He is been dealing with chest pain for about 5 weeks.  Started when he was moving something and he assumed that he strained his chest wall.  It is primarily been across his anterior chest but over the last 3 days or so it has been primarily left chest.  Any movement such as sitting up or walking will make it hurt.  He does not think the exertion itself makes it hurt.  It will take his breath away.  He was seen here for this about a week ago and had a negative workup including CTA and troponins.  He was put on hydrocodone but this has not helped.  He took some leftover morphine, NSAIDs, BC powders and nothing has helped.  Home Medications Prior to Admission medications   Medication Sig Start Date End Date Taking? Authorizing Provider  acetaminophen (TYLENOL) 500 MG tablet Take 500 mg by mouth every 6 (six) hours as needed.   Yes [provider]  amLODipine (NORVASC) 5 MG tablet Take 1 tablet by mouth daily. 05/20/22  Yes [provider]  Aspirin-Salicylamide-Caffeine (ARTHRITIS STRENGTH BC POWDER PO) Take 2 packets by mouth as needed (pain).   Yes [provider]  cyclobenzaprine (FLEXERIL) 10 MG tablet Take 10 mg by mouth as needed for muscle spasms.   Yes [provider]  gabapentin (NEURONTIN) 100 MG capsule Take 2 capsules by mouth 2 (two) times daily.   Yes [provider]  HYDROcodone-acetaminophen (NORCO/VICODIN) 5-325 MG tablet Take 2 tablets by mouth every 4 (four) hours as needed. 02/17/23  Yes Luvenia Heller, PA-C  ibuprofen (ADVIL) 200 MG tablet Take 800 mg by mouth every 6 (six) hours as needed  for moderate pain.   Yes [provider]  lurasidone (LATUDA) 20 MG TABS tablet Take 1 tablet by mouth daily.   Yes [provider]  rosuvastatin (CRESTOR) 10 MG tablet Take 10 mg by mouth daily.   Yes [provider]  venlafaxine XR (EFFEXOR-XR) 150 MG 24 hr capsule Take 1 capsule by mouth daily.   Yes [provider]  EPINEPHrine (EPIPEN 2-PAK) 0.3 mg/0.3 mL IJ SOAJ injection Take 1 auto by injection route as directed. Patient not taking: Reported on 02/25/2023 02/08/22   [provider]      Allergies    Bentyl [dicyclomine], Lisinopril, Penicillins, and Sulfa antibiotics    Review of Systems   Review of Systems  Cardiovascular:  Positive for chest pain.  Gastrointestinal:  Negative for abdominal pain.    Physical Exam Updated Vital Signs BP (!) 162/109   Pulse 81   Temp 98 F (36.7 C) (Oral)   Resp 16   Ht '5\' 11"'$  (1.803 m)   Wt (!) 156.5 kg   SpO2 100%   BMI 48.12 kg/m  Physical Exam Vitals and nursing note reviewed.  Constitutional:      Appearance: He is well-developed. He is obese. He is not ill-appearing or diaphoretic.  HENT:     Head: Normocephalic and atraumatic.  Cardiovascular:     Rate and Rhythm: Normal rate and regular rhythm.  Pulses:          Radial pulses are 2+ on the left side.     Heart sounds: Normal heart sounds.  Pulmonary:     Effort: Pulmonary effort is normal.     Breath sounds: Normal breath sounds.  Chest:     Chest wall: No tenderness.  Abdominal:     Palpations: Abdomen is soft.     Tenderness: There is no abdominal tenderness.  Skin:    General: Skin is warm and dry.  Neurological:     Mental Status: He is alert.     ED Results / Procedures / Treatments   Labs (all labs ordered are listed, but only abnormal results are displayed) Labs Reviewed  BASIC METABOLIC PANEL - Abnormal; Notable for the following components:      Result Value   Sodium 133 (*)    Chloride 96 (*)     Glucose, Bld 241 (*)    All other components within normal limits  CBC - Abnormal; Notable for the following components:   RBC 4.08 (*)    All other components within normal limits  BRAIN NATRIURETIC PEPTIDE  CBC  COMPREHENSIVE METABOLIC PANEL  MAGNESIUM  LIPID PANEL  URINALYSIS, ROUTINE W REFLEX MICROSCOPIC  TROPONIN I (HIGH SENSITIVITY)  TROPONIN I (HIGH SENSITIVITY)    EKG EKG Interpretation  Date/Time:  Tuesday February 25 2023 13:24:51 EST Ventricular Rate:  90 PR Interval:  150 QRS Duration: 96 QT Interval:  354 QTC Calculation: 433 R Axis:   41 Text Interpretation: Normal sinus rhythm  new q waves when compared to Feb 17 2023 Confirmed by Sherwood Gambler [phone]) on 02/25/2023 5:23:43 PM  Radiology DG Chest 2 View  Result Date: 02/25/2023 CLINICAL DATA:  Chest pain EXAM: CHEST - 2 VIEW COMPARISON:  02/17/2023 FINDINGS: Mild cardiomegaly. Both lungs are clear. Unchanged chronic fracture deformities of the right ribs. IMPRESSION: Cardiomegaly without acute abnormality of the lungs. Electronically Signed   By: Delanna Ahmadi M.D.   On: 02/25/2023 14:00    Procedures Procedures    Medications Ordered in ED Medications  morphine (PF) 4 MG/ML injection 4 mg (has no administration in time range)  aspirin chewable tablet 324 mg (324 mg Oral Given 02/25/23 1720)  ketorolac (TORADOL) 15 MG/ML injection 15 mg (15 mg Intravenous Given 02/25/23 1725)  HYDROmorphone (DILAUDID) injection 1 mg (1 mg Intravenous Given 02/25/23 1806)  hydrALAZINE (APRESOLINE) injection 10 mg (10 mg Intravenous Given 02/25/23 1848)    ED Course/ Medical Decision Making/ A&P                             Medical Decision Making Amount and/or Complexity of Data Reviewed External Data Reviewed: notes. Labs:     Details: Troponins are negative x 2. Radiology: ordered and independent interpretation performed.    Details: No CHF ECG/medicine tests: independent interpretation performed.    Details: New  inferior Q waves  Risk OTC drugs. Prescription drug management. Decision regarding hospitalization.   Patient's chest pain is pretty atypical and does not sound cardiac but he does have some risk factors including obesity, age, hypertension, hyperlipidemia.  He will be given aspirin and Toradol.  He is asking for a 2 mg IV Dilaudid shot to help with his "anxiety".  He states this will help with his chest pain.  While I do not think this is necessarily cardiac, he also does have new EKG changes from just a  few days ago.  While troponins are negative, I cannot ignore these new Q waves I discussed with Dr. Johney Frame.  She recommends admission to the hospital service with cards consult in the morning here at [facility] and they can decide about stress test or other acute workup.  Patient will be admitted to the hospital service.  I doubt dissection I do not think repeat CT imaging is needed.  Discussed with Dr. Clearence Ped.        Final Clinical Impression(s) / ED Diagnoses Final diagnoses:  Abnormal EKG    Rx / DC Orders ED Discharge Orders     None         Sherwood Gambler, MD 02/25/23 Joen Laura

## 2023-02-26 ENCOUNTER — Other Ambulatory Visit (HOSPITAL_COMMUNITY): Payer: Self-pay | Admitting: *Deleted

## 2023-02-26 ENCOUNTER — Observation Stay (HOSPITAL_BASED_OUTPATIENT_CLINIC_OR_DEPARTMENT_OTHER): Payer: 59

## 2023-02-26 DIAGNOSIS — I480 Paroxysmal atrial fibrillation: Secondary | ICD-10-CM | POA: Diagnosis present

## 2023-02-26 DIAGNOSIS — R0789 Other chest pain: Secondary | ICD-10-CM

## 2023-02-26 DIAGNOSIS — I10 Essential (primary) hypertension: Secondary | ICD-10-CM | POA: Diagnosis not present

## 2023-02-26 DIAGNOSIS — R079 Chest pain, unspecified: Secondary | ICD-10-CM

## 2023-02-26 DIAGNOSIS — R0782 Intercostal pain: Secondary | ICD-10-CM

## 2023-02-26 DIAGNOSIS — E785 Hyperlipidemia, unspecified: Secondary | ICD-10-CM | POA: Diagnosis not present

## 2023-02-26 LAB — ECHOCARDIOGRAM COMPLETE
AR max vel: 3.93 cm2
AV Area VTI: 4.3 cm2
AV Area mean vel: 3.84 cm2
AV Mean grad: 4.1 mmHg
AV Peak grad: 7.8 mmHg
Ao pk vel: 1.39 m/s
Area-P 1/2: 3.72 cm2
Height: 71 in
S' Lateral: 2 cm
Weight: 5629.67 oz

## 2023-02-26 LAB — COMPREHENSIVE METABOLIC PANEL
ALT: 47 U/L — ABNORMAL HIGH (ref 0–44)
AST: 35 U/L (ref 15–41)
Albumin: 3.8 g/dL (ref 3.5–5.0)
Alkaline Phosphatase: 93 U/L (ref 38–126)
Anion gap: 8 (ref 5–15)
BUN: 17 mg/dL (ref 6–20)
CO2: 27 mmol/L (ref 22–32)
Calcium: 9 mg/dL (ref 8.9–10.3)
Chloride: 101 mmol/L (ref 98–111)
Creatinine, Ser: 0.86 mg/dL (ref 0.61–1.24)
GFR, Estimated: >60 mL/min (ref >60)
Glucose, Bld: 119 mg/dL — ABNORMAL HIGH (ref 70–99)
Potassium: 4 mmol/L (ref 3.5–5.1)
Sodium: 136 mmol/L (ref 135–145)
Total Bilirubin: 0.3 mg/dL (ref 0.3–1.2)
Total Protein: 6.8 g/dL (ref 6.5–8.1)

## 2023-02-26 LAB — HIV ANTIBODY (ROUTINE TESTING W REFLEX): HIV Screen 4th Generation wRfx: NONREACTIVE

## 2023-02-26 LAB — MAGNESIUM: Magnesium: 1.9 mg/dL (ref 1.7–2.4)

## 2023-02-26 LAB — CBC
HCT: 38.8 % — ABNORMAL LOW (ref 39.0–52.0)
Hemoglobin: 12.8 g/dL — ABNORMAL LOW (ref 13.0–17.0)
MCH: 31.8 pg (ref 26.0–34.0)
MCHC: 33 g/dL (ref 30.0–36.0)
MCV: 96.5 fL (ref 80.0–100.0)
Platelets: 177 10*3/uL (ref 150–400)
RBC: 4.02 MIL/uL — ABNORMAL LOW (ref 4.22–5.81)
RDW: 13.5 % (ref 11.5–15.5)
WBC: 4.5 10*3/uL (ref 4.0–10.5)
nRBC: 0 % (ref 0.0–0.2)

## 2023-02-26 LAB — LIPID PANEL
Cholesterol: 163 mg/dL (ref 0–200)
HDL: 52 mg/dL (ref >40)
LDL Cholesterol: 92 mg/dL (ref 0–99)
Total CHOL/HDL Ratio: 3.1 RATIO
Triglycerides: 94 mg/dL (ref <150)
VLDL: 19 mg/dL (ref 0–40)

## 2023-02-26 LAB — TROPONIN I (HIGH SENSITIVITY)
Troponin I (High Sensitivity): 10 ng/L (ref <18)
Troponin I (High Sensitivity): 9 ng/L (ref <18)

## 2023-02-26 MED ORDER — PERFLUTREN LIPID MICROSPHERE
1.0000 mL | INTRAVENOUS | Status: AC | PRN
  Administered 2023-02-26: 4 mL via INTRAVENOUS

## 2023-02-26 MED ORDER — METHOCARBAMOL 500 MG PO TABS
500.0000 mg | ORAL_TABLET | Freq: Three times a day (TID) | ORAL | Status: DC
  Administered 2023-02-26 – 2023-02-27 (×3): 500 mg via ORAL
  Filled 2023-02-26 (×4): qty 1

## 2023-02-26 MED ORDER — METHOCARBAMOL 500 MG PO TABS: 500.0000 mg | ORAL_TABLET | Freq: Three times a day (TID) | ORAL | 0 refills | Status: AC

## 2023-02-26 MED ORDER — IBUPROFEN 200 MG PO TABS: 800.0000 mg | ORAL_TABLET | Freq: Four times a day (QID) | ORAL | 0 refills | Status: DC | PRN

## 2023-02-26 MED ORDER — NAPROXEN 500 MG PO TABS: 500.0000 mg | ORAL_TABLET | Freq: Two times a day (BID) | ORAL | 0 refills | Status: AC

## 2023-02-26 NOTE — Care Management Obs Status (Signed)
[facility] NOTIFICATION   Patient Details  Name: Chad Lester MRN: [id number] Date of Birth: 09-27-1966   Medicare Observation Status Notification Given:  Yes    Pessy Delamar Chip Boer 02/26/2023, 1:06 PM

## 2023-02-26 NOTE — Hospital Course (Signed)
Chad Lester is a 57 y.o. male with medical history significant of hyperlipidemia, hypertension, PTSD, mood disorder, presents the ED with a chief complaint of chest pain.  Patient reports that the pain feels like torn muscles.  He reports it feels like a broken rib only more intense.  It started 4 weeks ago.  He was loading a truck with heavy boxes when he became unbalanced and tried to catch himself.  The pain has been present since then.  He took a few days of rest but the pain continued to get worse.  At that time it was moving from the right all the way over to the left.  1 week ago he was seen in the ER and given hydrocodone.  His pain on the right started to improve, but the pain on the left continue to get worse.  Any exertion makes it worse.  The pain "takes his breath away. "  It is still there at rest sometimes.  It wakes him out of sleep sometimes.  He has associated palpitations but reports that he has a history of A-fib, and occasionally gets palpitations.  He reports he had ablation years ago.  He has not had to be on a blood thinner for atrial fibrillation in 7 years per his report.  Patient has never had a stress test.  He has not seen a cardiologist in years.  He feels anxious and wants Dilaudid to help with that.  He has been informed that I do not start without start on narcotics and he has a pain scale that includes Tylenol, oxycodone, and morphine.   Patient reports he has a history of DVT.  It was provoked from a drive from [place] to [place].  He has recently been making multiple trips from [place] to [place].  At his last visit to the ER he had a CTA that was negative for pulmonary embolus.  He has not made any long drive since then.  Patient reports his dyspnea on exertion has been present for about a year.  It does seem worse with this pain.  Patient has no other complaints.   Patient vapes, and occasionally smokes marijuana.  He does not drink alcohol.  He is vaccinated  for COVID and flu.  Patient is full code.

## 2023-02-26 NOTE — Progress Notes (Signed)
Pt complained of pain multiple times during the night, PRN morphine given with reported relief. Pt hypertensive this a.m., other vitals stable.

## 2023-02-26 NOTE — Discharge Summary (Signed)
Physician Discharge Summary   Patient: Chad Lester MRN: [id number] DOB: 10-Oct-1966  Admit date:     02/25/2023  Discharge date: 02/26/23  Discharge Physician: Deatra James   PCP: Pcp, No   Recommendations at discharge:   Follow-up with PCP/cardiology in 2-4 weeks Follow-up with PCP closely regarding episodic hypoglycemia, A1c ordered, to rule out DM2 Heart healthy diet recommended weight loss increase activity exercise recommended Avoid any heavy lifting exercising May take aspirin as directed by PCP, cardiology, continue anti-inflammatory such as NSAIDs or Tylenol along with muscle relaxants for pectoral muscle pain  Discharge Diagnoses: Principal Problem:   Chest pain Active Problems:   Essential hypertension   Chronic pain   Hyperlipidemia   PTSD (post-traumatic stress disorder)   Hyperglycemia  Resolved Problems:   * No resolved hospital problems. *  Hospital Course: Chad Lester is a 57 y.o. male with medical history significant of hyperlipidemia, hypertension, PTSD, mood disorder, presents the ED with a chief complaint of chest pain.  Patient reports that the pain feels like torn muscles.  He reports it feels like a broken rib only more intense.  It started 4 weeks ago.  He was loading a truck with heavy boxes when he became unbalanced and tried to catch himself.  The pain has been present since then.  He took a few days of rest but the pain continued to get worse.  At that time it was moving from the right all the way over to the left.  1 week ago he was seen in the ER and given hydrocodone.  His pain on the right started to improve, but the pain on the left continue to get worse.  Any exertion makes it worse.  The pain "takes his breath away. "  It is still there at rest sometimes.  It wakes him out of sleep sometimes.  He has associated palpitations but reports that he has a history of A-fib, and occasionally gets palpitations.  He reports he had ablation  years ago.  He has not had to be on a blood thinner for atrial fibrillation in 7 years per his report.  Patient has never had a stress test.  He has not seen a cardiologist in years.  He feels anxious and wants Dilaudid to help with that.  He has been informed that I do not start without start on narcotics and he has a pain scale that includes Tylenol, oxycodone, and morphine.   Patient reports he has a history of DVT.  It was provoked from a drive from [place] to [place].  He has recently been making multiple trips from [place] to [place].  At his last visit to the ER he had a CTA that was negative for pulmonary embolus.  He has not made any long drive since then.  Patient reports his dyspnea on exertion has been present for about a year.  It does seem worse with this pain.  Patient has no other complaints.   Patient vapes, and occasionally smokes marijuana.  He does not drink alcohol.  He is vaccinated for COVID and flu.  Patient is full code.  Assessment and Plan: * Chest pain - Atypical chest pain - Left-sided pectoralis - chest tenderness - Chest pain wrapping all the way around chest, below the level of T4, with associated dyspnea - Troponin 7, 6, 10, 9 - Patient has been seen several times in the ER for the same complaint, and the comparison of EKG shows ?  new Q waves - Troponins remain flat, 2D echocardiogram reviewed, within normal limits Patient has been cleared by cardiology to be discharged home   - Monitor on telemetry - Trend troponin in the a.m. again - Chest x-ray showed cardiomegaly without acute abnormality - Patient was given aspirin - Defer further cardiac studies to cardiology - Continue to monitor  Hyperglycemia Able to no reported history of diabetes A1c has been send, follow-up with results with PCP  PTSD (post-traumatic stress disorder) - Continue Effexor  Hyperlipidemia - Continue Crestor  Chronic pain - Continue gabapentin 200 mg twice  daily -Factorial chest pain, likely musculoskeletal -Will consider anti-inflammatories including Robaxin muscle relaxants added  Essential hypertension - Continue Norvasc 5 mg - 1 dose of hydralazine 10 mg IV was also given in the ED for your diastolic blood pressure [id number] -Blood pressure stable        Consultants: Cardiology Dr. Harl Bowie Procedures performed: Labs, troponins, 2D echocardiogram Disposition: Home Diet recommendation:  Discharge Diet Orders (From admission, onward)     Start     Ordered   02/26/23 0000  Diet - low sodium heart healthy        02/26/23 1228           Cardiac diet DISCHARGE MEDICATION: Allergies as of 02/26/2023       Reactions   Bentyl [dicyclomine]    Lisinopril    Penicillins    Sulfa Antibiotics         Medication List     TAKE these medications    acetaminophen 500 MG tablet Commonly known as: TYLENOL Take 500 mg by mouth every 6 (six) hours as needed.   amLODipine 5 MG tablet Commonly known as: NORVASC Take 1 tablet by mouth daily.   ARTHRITIS STRENGTH BC POWDER PO Take 2 packets by mouth as needed (pain).   cyclobenzaprine 10 MG tablet Commonly known as: FLEXERIL Take 10 mg by mouth as needed for muscle spasms.   EpiPen 2-Pak 0.3 mg/0.3 mL Soaj injection Generic drug: EPINEPHrine Take 1 auto by injection route as directed.   gabapentin 100 MG capsule Commonly known as: NEURONTIN Take 2 capsules by mouth 2 (two) times daily.   HYDROcodone-acetaminophen 5-325 MG tablet Commonly known as: NORCO/VICODIN Take 2 tablets by mouth every 4 (four) hours as needed.   ibuprofen 200 MG tablet Commonly known as: ADVIL Take 4 tablets (800 mg total) by mouth every 6 (six) hours as needed for moderate pain.   Latuda 20 MG Tabs tablet Generic drug: lurasidone Take 1 tablet by mouth daily.   methocarbamol 500 MG tablet Commonly known as: ROBAXIN Take 1 tablet (500 mg total) by mouth 3 (three) times daily for 10 days.    rosuvastatin 10 MG tablet Commonly known as: CRESTOR Take 10 mg by mouth daily.   venlafaxine XR 150 MG 24 hr capsule Commonly known as: EFFEXOR-XR Take 1 capsule by mouth daily.        Discharge Exam: Filed Weights   02/25/23 1322 02/25/23 1802 02/25/23 2109  Weight: (!) 154.2 kg (!) 156.5 kg (!) 159.6 kg        General:  AAO x 3,  cooperative, no distress;   HEENT:  Normocephalic, PERRL, otherwise with in Normal limits   Neuro:  CNII-XII intact. , normal motor and sensation, reflexes intact   Lungs:   Clear to auscultation BL, Respirations unlabored,  No wheezes / crackles  Cardio:    S1/S2, RRR, No murmure, No Rubs or Gallops  Abdomen:  Soft, non-tender, bowel sounds active all four quadrants, no guarding or peritoneal signs.  Muscular  skeletal:  Limited exam -global generalized weaknesses - in bed, able to move all 4 extremities,   2+ pulses,  symmetric, No pitting edema  Skin:  Dry, warm to touch, negative for any Rashes,  Wounds: Please see nursing documentation          Condition at discharge: good  The results of significant diagnostics from this hospitalization (including imaging, microbiology, ancillary and laboratory) are listed below for reference.   Imaging Studies: DG Chest 2 View  Result Date: 02/25/2023 CLINICAL DATA:  Chest pain EXAM: CHEST - 2 VIEW COMPARISON:  02/17/2023 FINDINGS: Mild cardiomegaly. Both lungs are clear. Unchanged chronic fracture deformities of the right ribs. IMPRESSION: Cardiomegaly without acute abnormality of the lungs. Electronically Signed   By: Delanna Ahmadi M.D.   On: 02/25/2023 14:00   CT Angio Chest PE W/Cm &/Or Wo Cm  Result Date: 02/17/2023 CLINICAL DATA:  Pulmonary embolism suspected, probability EXAM: CT ANGIOGRAPHY CHEST WITH CONTRAST TECHNIQUE: Multidetector CT imaging of the chest was performed using the standard protocol during bolus administration of intravenous contrast. Multiplanar CT image  reconstructions and MIPs were obtained to evaluate the vascular anatomy. RADIATION DOSE REDUCTION: This exam was performed according to the departmental dose-optimization program which includes automated exposure control, adjustment of the mA and/or kV according to patient size and/or use of iterative reconstruction technique. CONTRAST:  38m OMNIPAQUE IOHEXOL 350 MG/ML SOLN COMPARISON:  None Available. FINDINGS: Cardiovascular: Satisfactory opacification of the pulmonary arteries to the segmental level. No evidence of pulmonary embolism. Normal heart size. No pericardial effusion. Mediastinum/Nodes: Unremarkable CT appearance of the thyroid gland. No suspicious mediastinal or hilar adenopathy. No soft tissue mediastinal mass. The thoracic esophagus is unremarkable. Lungs/Pleura: Diffuse mild lower lobe bronchial wall thickening. Mild dependent atelectasis. Upper Abdomen: Visualized upper abdominal organs are unremarkable. Musculoskeletal: No acute fracture or aggressive appearing lytic or blastic osseous lesion. Multiple healed right-sided rib fractures. Review of the MIP images confirms the above findings. IMPRESSION: 1. Negative for acute pulmonary embolus, pneumonia or other acute cardiopulmonary process. 2. Diffuse mild lower lobe bronchial wall thickening suggests acute versus chronic bronchitis. 3. Multiple healed right-sided rib fractures. Electronically Signed   By: HJacqulynn CadetM.D.   On: 02/17/2023 12:29   DG Chest 2 View  Result Date: 02/17/2023 CLINICAL DATA:  Chest pain EXAM: CHEST - 2 VIEW COMPARISON:  01/30/2006 FINDINGS: Chronic posttraumatic changes right posterior ribs. Prominent cardiac silhouette. No pneumothorax or pleural effusion. Calcified aorta. IMPRESSION: Enlarged cardiac silhouette. Electronically Signed   By: JSammie BenchM.D.   On: 02/17/2023 10:32    Microbiology: No results found for this or any previous visit.  Labs: CBC: Recent Labs  Lab 02/25/23 1357  02/26/23 0420  WBC 5.1 4.5  HGB 13.0 12.8*  HCT 39.3 38.8*  MCV 96.3 96.5  PLT 192 [id number]  Basic Metabolic Panel: Recent Labs  Lab 02/25/23 1357 02/26/23 0420  NA 133* 136  K 4.1 4.0  CL 96* 101  CO2 26 27  GLUCOSE 241* 119*  BUN 14 17  CREATININE 1.00 0.86  CALCIUM 9.1 9.0  MG  --  1.9   Liver Function Tests: Recent Labs  Lab 02/26/23 0420  AST 35  ALT 47*  ALKPHOS 93  BILITOT 0.3  PROT 6.8  ALBUMIN 3.8   CBG: No results for input(s): "GLUCAP" in the last 168 hours.  Discharge time spent: greater  than 30 minutes.  Signed: Deatra James, MD [facility] 02/26/2023

## 2023-02-26 NOTE — TOC Progression Note (Addendum)
  Transition of Care [facility]) Screening Note   Patient Details  Name: Chad Lester Date of Birth: Sep 16, 1966   Transition of Care [facility]) CM/SW Contact:    Boneta Lucks, RN Phone Number: 02/26/2023, 10:31 AM  Echo needed, assess for Cath vs DC home.  Transition of Care Department [facility]) has reviewed patient and no TOC needs have been identified at this time. We will continue to monitor patient advancement through interdisciplinary progression rounds. If new patient transition needs arise, please place a TOC consult.      Barriers to Discharge: Continued Medical Work up

## 2023-02-26 NOTE — Consult Note (Addendum)
Cardiology Consultation   Patient ID: Chad Lester MRN: [id number]; DOB: 16-Oct-1966  Admit date: 02/25/2023 Date of Consult: 02/26/2023  PCP:  Merryl Hacker No   [facility] Cardiologist:  New  Patient Profile:   Chad Lester is a 57 y.o. male with a hx of HLD, HTN, PTSD, h/o DVT, afib s/p ablations (in [place]), and mood disorder who is being seen 02/26/2023 for the evaluation of chest pain at the request of Dr. Roger Shelter.  History of Present Illness:   Chad Lester is currently moving back from [place].  In [place] patient did follow with a cardiologist.  He reports a history of afib s/p ablations. He has not been on a blood thinner in 7 years. He has not been seen by a cardiologist in 7 years.  He has been on medication for blood pressure and cholesterol.  Patient has been vaping for about 4 years, prior to this he smoked.  Patient denies alcohol or drug use.  The patient presented to the ER 02/25/23 with chest pain. Started 4-5 weeks ago when he became unbalanced and tried to catch himself. He took some days off work, but the pain persisted. A week prior to presentation he went to the ER and was given pain medications.  He now describes a left-sided chest pain that he says feels like muscle cramps and electricity.  He feels it is similar to a broken rib.  Pain is worse when he tries to sit up or move.  Pain is mildly worse with exertion.  He does feel short of breath with the pain.  In the ER BP 162/109, pulse 81bpm, afebrile, RR 16, 100%O2.HS trop negative x3.  CXR non acute. He was given Aspirin and Toradol. EKG showed new inferior Q waves since prior EKG and patient was admitted for further work-up.   Past Medical History:  Diagnosis Date   High cholesterol    Hypertension    PTSD (post-traumatic stress disorder)     History reviewed. No pertinent surgical history.   Home Medications:  Prior to Admission medications   Medication Sig Start Date End Date  Taking? Authorizing Provider  acetaminophen (TYLENOL) 500 MG tablet Take 500 mg by mouth every 6 (six) hours as needed.   Yes [provider]  amLODipine (NORVASC) 5 MG tablet Take 1 tablet by mouth daily. 05/20/22  Yes [provider]  Aspirin-Salicylamide-Caffeine (ARTHRITIS STRENGTH BC POWDER PO) Take 2 packets by mouth as needed (pain).   Yes [provider]  cyclobenzaprine (FLEXERIL) 10 MG tablet Take 10 mg by mouth as needed for muscle spasms.   Yes [provider]  gabapentin (NEURONTIN) 100 MG capsule Take 2 capsules by mouth 2 (two) times daily.   Yes [provider]  HYDROcodone-acetaminophen (NORCO/VICODIN) 5-325 MG tablet Take 2 tablets by mouth every 4 (four) hours as needed. 02/17/23  Yes Luvenia Heller, PA-C  ibuprofen (ADVIL) 200 MG tablet Take 800 mg by mouth every 6 (six) hours as needed for moderate pain.   Yes [provider]  lurasidone (LATUDA) 20 MG TABS tablet Take 1 tablet by mouth daily.   Yes [provider]  rosuvastatin (CRESTOR) 10 MG tablet Take 10 mg by mouth daily.   Yes [provider]  venlafaxine XR (EFFEXOR-XR) 150 MG 24 hr capsule Take 1 capsule by mouth daily.   Yes [provider]  EPINEPHrine (EPIPEN 2-PAK) 0.3 mg/0.3 mL IJ SOAJ injection Take 1 auto by injection route as  directed. Patient not taking: Reported on 02/25/2023 02/08/22   [provider]    Inpatient Medications: Scheduled Meds:  amLODipine  5 mg Oral Daily   gabapentin  200 mg Oral BID   heparin  5,000 Units Subcutaneous Q8H   lurasidone  20 mg Oral Q breakfast   rosuvastatin  10 mg Oral Daily   venlafaxine XR  150 mg Oral Q breakfast   Continuous Infusions:  PRN Meds: acetaminophen, morphine injection, ondansetron (ZOFRAN) IV  Allergies:    Allergies  Allergen Reactions   Bentyl [Dicyclomine]    Lisinopril    Penicillins    Sulfa Antibiotics     Social History:   Social History    Socioeconomic History   Marital status: Married    Spouse name: Not on file   Number of children: Not on file   Years of education: Not on file   Highest education level: Not on file  Occupational History   Not on file  Tobacco Use   Smoking status: Some Days    Types: Cigarettes   Smokeless tobacco: Never  Vaping Use   Vaping Use: Never used  Substance and Sexual Activity   Alcohol use: Not Currently   Drug use: Yes    Types: Marijuana   Sexual activity: Not on file  Other Topics Concern   Not on file  Social History Narrative   Not on file   Social Determinants of Health   Financial Resource Strain: Not on file  Food Insecurity: Not on file  Transportation Needs: Not on file  Physical Activity: Not on file  Stress: Not on file  Social Connections: Not on file  Intimate Partner Violence: Not on file    Family History:   History reviewed. No pertinent family history.   ROS:  Please see the history of present illness.   All other ROS reviewed and negative.     Physical Exam/Data:   Vitals:   02/25/23 2031 02/25/23 2109 02/26/23 0006 02/26/23 0339  BP: 90/78  118/85 (!) 144/104  Pulse: 90  77 73  Resp: '20  18 20  '$ Temp: 97.7 F (36.5 C)  97.6 F (36.4 C) 97.6 F (36.4 C)  TempSrc: Oral  Oral Oral  SpO2: 98%  97% 95%  Weight:  (!) 159.6 kg    Height:  '5\' 11"'$  (1.803 m)      Intake/Output Summary (Last 24 hours) at 02/26/2023 0804 Last data filed at 02/26/2023 [id number] Gross per 24 hour  Intake --  Output 800 ml  Net -800 ml      02/25/2023    9:09 PM 02/25/2023    6:02 PM 02/25/2023    1:22 PM  Last 3 Weights  Weight (lbs) 351 lb 13.7 oz 345 lb 340 lb  Weight (kg) 159.6 kg 156.491 kg 154.223 kg     Body mass index is 49.07 kg/m.  General:  Well nourished, well developed, in no acute distress HEENT: normal Neck: no JVD Vascular: No carotid bruits; Distal pulses 2+ bilaterally Cardiac:  normal S1, S2; RRR; no murmur  Lungs:  clear to auscultation  bilaterally, no wheezing, rhonchi or rales  Abd: soft, nontender, no hepatomegaly  Ext: no edema Musculoskeletal:  No deformities, BUE and BLE strength normal and equal Skin: warm and dry  Neuro:  CNs 2-12 intact, no focal abnormalities noted Psych:  Normal affect   EKG:  The EKG was personally reviewed and demonstrates:  NSR 90bpm, q waves III and aVF Telemetry:  Telemetry was personally reviewed and demonstrates:  NSR HR 70  Relevant CV Studies:  Echo ordered  Laboratory Data:  High Sensitivity Troponin:   Recent Labs  Lab 02/17/23 1003 02/17/23 1206 02/25/23 1357 02/25/23 1600 02/26/23 0420  TROPONINIHS '6 7 7 6 10     '$ Chemistry Recent Labs  Lab 02/25/23 1357 02/26/23 0420  NA 133* 136  K 4.1 4.0  CL 96* 101  CO2 26 27  GLUCOSE 241* 119*  BUN 14 17  CREATININE 1.00 0.86  CALCIUM 9.1 9.0  MG  --  1.9  GFRNONAA >60 >60  ANIONGAP 11 8    Recent Labs  Lab 02/26/23 0420  PROT 6.8  ALBUMIN 3.8  AST 35  ALT 47*  ALKPHOS 93  BILITOT 0.3   Lipids  Recent Labs  Lab 02/26/23 0420  CHOL 163  TRIG 94  HDL 52  LDLCALC 92  CHOLHDL 3.1    Hematology Recent Labs  Lab 02/25/23 1357 02/26/23 0420  WBC 5.1 4.5  RBC 4.08* 4.02*  HGB 13.0 12.8*  HCT 39.3 38.8*  MCV 96.3 96.5  MCH 31.9 31.8  MCHC 33.1 33.0  RDW 13.6 13.5  PLT 192 177   Thyroid No results for input(s): "TSH", "FREET4" in the last 168 hours.  BNP Recent Labs  Lab 02/25/23 1357  BNP 19.0    DDimer No results for input(s): "DDIMER" in the last 168 hours.   Radiology/Studies:  DG Chest 2 View  Result Date: 02/25/2023 CLINICAL DATA:  Chest pain EXAM: CHEST - 2 VIEW COMPARISON:  02/17/2023 FINDINGS: Mild cardiomegaly. Both lungs are clear. Unchanged chronic fracture deformities of the right ribs. IMPRESSION: Cardiomegaly without acute abnormality of the lungs. Electronically Signed   By: Delanna Ahmadi M.D.   On: 02/25/2023 14:00     Assessment and Plan:   Chest pain - patient  presents with more atypical chest pain. It is worse with movement such as sitting up. He does feel SOB with the pain - HS trop neative x 3 - EKG with new q waves inf leads - RF include HTN, HLD, obesity, smoking history - echo ordered - patient ate this morning, can possibly perform a stress test tomorrow however if echo is normal can maybe send home. Can try muscle relaxer.  HLD - LDL 92 - continue Crestor '10mg'$  daily  HTN - amlodipine '5mg'$  daily - BP labile, overall OK   For questions or updates, please contact [place] Please consult [email or web address] for contact info under    Signed, Cadence Ninfa Meeker, PA-C  02/26/2023 8:04 AM   Attending note Patient seen and discussed with PA Further, I agree with her documentation  57 yo male history of HTN, HL, PTSD, admitted with chest pain.  He was loading a truck with heavy boxes when he became unbalanced and tried to catch himself. The pain has been present since then. Since that time constant cramping like pain across entire chest worst with position. Some SOB at times.   K 4.1 Cr 1 BUN 14 WBC 5.1 Hgb 13 Plt 192 BNP 19  Trop 7-->6-->10-->9 CXR no acute process CT PE no PE, changes of bronchitis, multiple healed right sided rib fractures EKG SR, inferior Qwaves    1.Chest pain - noncardiac chest pain. Started 5 weeks ago after loading heavy boxes with partial fall. Constant x 5 weeks though varies in severity, worst with position.  -trops negative, EKG no acute ischemic changes. Inferiro Qwaves not present on prior  EKG.  - follow up echo - 350 lbs, BMI 49. Noninvasive imaging will be challening - I don't see indication for stress testing at this time unless abnormal findings on echo. Symptoms are noncardiac, noninvasive testing is challenging due to BMI 49. If echo benign then would plan for outpatient course of anti inflammatories/muscle relaxers.     Carlyle Dolly MD

## 2023-02-26 NOTE — Progress Notes (Signed)
PROGRESS NOTE    Patient: Chad Lester                            PCP: Pcp, No                    DOB: Apr 03, 1966            DOA: 02/25/2023 [id number]             DOS: 02/26/2023, 12:26 PM   LOS: 0 days   Date of Service: The patient was seen and examined on 02/26/2023  Subjective:   The patient was seen and examined this morning. Hemodynamically stable. No issues overnight .  Brief Narrative:   Chad Lester is a 57 y.o. male with medical history significant of hyperlipidemia, hypertension, PTSD, mood disorder, presents the ED with a chief complaint of chest pain.  Patient reports that the pain feels like torn muscles.  He reports it feels like a broken rib only more intense.  It started 4 weeks ago.  He was loading a truck with heavy boxes when he became unbalanced and tried to catch himself.  The pain has been present since then.  He took a few days of rest but the pain continued to get worse.  At that time it was moving from the right all the way over to the left.  1 week ago he was seen in the ER and given hydrocodone.  His pain on the right started to improve, but the pain on the left continue to get worse.  Any exertion makes it worse.  The pain "takes his breath away. "  It is still there at rest sometimes.  It wakes him out of sleep sometimes.  He has associated palpitations but reports that he has a history of A-fib, and occasionally gets palpitations.  He reports he had ablation years ago.  He has not had to be on a blood thinner for atrial fibrillation in 7 years per his report.  Patient has never had a stress test.  He has not seen a cardiologist in years.  He feels anxious and wants Dilaudid to help with that.  He has been informed that I do not start without start on narcotics and he has a pain scale that includes Tylenol, oxycodone, and morphine.   Patient reports he has a history of DVT.  It was provoked from a drive from [place] to [place].  He has recently  been making multiple trips from [place] to [place].  At his last visit to the ER he had a CTA that was negative for pulmonary embolus.  He has not made any long drive since then.  Patient reports his dyspnea on exertion has been present for about a year.  It does seem worse with this pain.  Patient has no other complaints.   Patient vapes, and occasionally smokes marijuana.  He does not drink alcohol.  He is vaccinated for COVID and flu.  Patient is full code.    Assessment & Plan:   Principal Problem:   Chest pain Active Problems:   Essential hypertension   Chronic pain   Hyperlipidemia   PTSD (post-traumatic stress disorder)   Hyperglycemia     Assessment and Plan: * Chest pain - Atypical chest pain - Left-sided pectoralis - chest tenderness - Chest pain wrapping all the way around chest, below the level of T4, with associated dyspnea - Troponin  7, 6, 10, 9 - Patient has been seen several times in the ER for the same complaint, and the comparison of EKG shows ? new Q waves - Cardiology consulted, evaluated the patient, pending 2D echocardiogram After echo normal can be discharged home, if abnormal consider cardiac cath   - Monitor on telemetry - Trend troponin in the a.m. again - Chest x-ray showed cardiomegaly without acute abnormality - Patient was given aspirin - Defer further cardiac studies to cardiology - Continue to monitor  Hyperglycemia Able to no reported history of diabetes Check hemoglobin A1c i  PTSD (post-traumatic stress disorder) - Continue Effexor  Hyperlipidemia - Continue Crestor  Chronic pain - Continue gabapentin 200 mg twice daily -Factorial chest pain, likely musculoskeletal -Will consider anti-inflammatories including Robaxin muscle relaxants added  Essential hypertension - Continue Norvasc 5 mg - 1 dose of hydralazine 10 mg IV was also given in the ED for your diastolic blood pressure [id number] -Blood pressure  stable     -------------------------------------------------------------------------------------------------------------------------- Nutritional status:  The patient's BMI is: Body mass index is 49.07 kg/m. I agree with the assessment and plan as outlined below: Nutrition Status:     ---------------------------------------------------------------------------------------------------------------------  DVT prophylaxis:  heparin injection 5,000 Units Start: 02/25/23 2200 SCDs Start: 02/25/23 2031   Code Status:   Code Status: Full Code  Family Communication: No family member present at bedside- attempt will be made to update daily The above findings and plan of care has been discussed with patient (and family)  in detail,  they expressed understanding and agreement of above. -Advance care planning has been discussed.   Admission status:   Status is: Observation The patient remains OBS appropriate and will d/c before 2 midnights.   Disposition: From  - home             Planning for discharge in 1-2 days: to   Procedures:   No admission procedures for hospital encounter.   Antimicrobials:  Anti-infectives (From admission, onward)    None        Medication:   amLODipine  5 mg Oral Daily   gabapentin  200 mg Oral BID   heparin  5,000 Units Subcutaneous Q8H   lurasidone  20 mg Oral Q breakfast   methocarbamol  500 mg Oral TID   rosuvastatin  10 mg Oral Daily   venlafaxine XR  150 mg Oral Q breakfast    acetaminophen, morphine injection, ondansetron (ZOFRAN) IV   Objective:   Vitals:   02/25/23 2109 02/26/23 0006 02/26/23 0339 02/26/23 0906  BP:  118/85 (!) 144/104 (!) 137/96  Pulse:  77 73 83  Resp:  '18 20 18  '$ Temp:  97.6 F (36.4 C) 97.6 F (36.4 C) 97.8 F (36.6 C)  TempSrc:  Oral Oral Oral  SpO2:  97% 95% 95%  Weight: (!) 159.6 kg     Height: '5\' 11"'$  (1.803 m)       Intake/Output Summary (Last 24 hours) at 02/26/2023 1226 Last data filed at  02/26/2023 0900 Gross per 24 hour  Intake 240 ml  Output 800 ml  Net -560 ml   Filed Weights   02/25/23 1322 02/25/23 1802 02/25/23 2109  Weight: (!) 154.2 kg (!) 156.5 kg (!) 159.6 kg     Physical examination:   Constitution:  Alert, cooperative, no distress,  Appears calm and comfortable  Psychiatric:   Normal and stable mood and affect, cognition intact,   HEENT:        Normocephalic, PERRL, otherwise  with in Normal limits  Chest:         Mild left-sided pectoral tenderness - chest symmetric Cardio vascular:  S1/S2, RRR, No murmure, No Rubs or Gallops  pulmonary: Clear to auscultation bilaterally, respirations unlabored, negative wheezes / crackles Abdomen: Soft, non-tender, non-distended, bowel sounds,no masses, no organomegaly Muscular skeletal: Limited exam - in bed, able to move all 4 extremities,   Neuro: CNII-XII intact. , normal motor and sensation, reflexes intact  Extremities: No pitting edema lower extremities, +2 pulses  Skin: Dry, warm to touch, negative for any Rashes, No open wounds Wounds: per nursing documentation   ------------------------------------------------------------------------------------------------------------------------------------------    LABs:     Latest Ref Rng & Units 02/26/2023    4:20 AM 02/25/2023    1:57 PM 02/17/2023   10:03 AM  CBC  WBC 4.0 - 10.5 K/uL 4.5  5.1  3.9   Hemoglobin 13.0 - 17.0 g/dL 12.8  13.0  11.4   Hematocrit 39.0 - 52.0 % 38.8  39.3  33.9   Platelets 150 - 400 K/uL 177  192  160       Latest Ref Rng & Units 02/26/2023    4:20 AM 02/25/2023    1:57 PM 02/17/2023   10:03 AM  CMP  Glucose 70 - 99 mg/dL 119  241  139   BUN 6 - 20 mg/dL '17  14  16   '$ Creatinine 0.61 - 1.24 mg/dL 0.86  1.00  0.95   Sodium 135 - 145 mmol/L 136  133  138   Potassium 3.5 - 5.1 mmol/L 4.0  4.1  3.9   Chloride 98 - 111 mmol/L 101  96  106   CO2 22 - 32 mmol/L '27  26  24   '$ Calcium 8.9 - 10.3 mg/dL 9.0  9.1  8.5   Total Protein 6.5 -  8.1 g/dL 6.8   6.2   Total Bilirubin 0.3 - 1.2 mg/dL 0.3   0.4   Alkaline Phos 38 - 126 U/L 93   86   AST 15 - 41 U/L 35   33   ALT 0 - 44 U/L 47   38        Micro Results No results found for this or any previous visit (from the past 240 hour(s)).  Radiology Reports DG Chest 2 View  Result Date: 02/25/2023 CLINICAL DATA:  Chest pain EXAM: CHEST - 2 VIEW COMPARISON:  02/17/2023 FINDINGS: Mild cardiomegaly. Both lungs are clear. Unchanged chronic fracture deformities of the right ribs. IMPRESSION: Cardiomegaly without acute abnormality of the lungs. Electronically Signed   By: Delanna Ahmadi M.D.   On: 02/25/2023 14:00    SIGNED: Deatra James, MD, FHM. FAAFP. [facility] - [facility] Time spent > 35 min.  In seeing, evaluating and examining the patient. Reviewing medical records, labs, drawn plan of care. [facility],  Pager (please use [email or web address] to page/ text) Please use Epic Secure Chat for non-urgent communication (7AM-7PM)  If 7PM-7AM, please contact night-coverage [email or web address], 02/26/2023, 12:26 PM

## 2023-02-26 NOTE — Progress Notes (Signed)
*  PRELIMINARY RESULTS* Echocardiogram 2D Echocardiogram has been performed with [facility].  Samuel Germany 02/26/2023, 4:14 PM

## 2023-02-27 DIAGNOSIS — R0789 Other chest pain: Secondary | ICD-10-CM | POA: Diagnosis not present

## 2023-02-27 LAB — HEMOGLOBIN A1C
Hgb A1c MFr Bld: 6.1 % — ABNORMAL HIGH (ref 4.8–5.6)
Mean Plasma Glucose: 128 mg/dL

## 2023-02-27 NOTE — Care Management Obs Status (Signed)
[facility] NOTIFICATION   Patient Details  Name: Chad Lester MRN: [id number] Date of Birth: [birth year]   Medicare Observation Status Notification Given:  Yes    Boneta Lucks, RN 02/27/2023, 9:18 AM

## 2023-02-27 NOTE — Care Management CC44 (Signed)
Condition Code 44 Documentation Completed  Patient Details  Name: Chad Lester MRN: [id number] Date of Birth: 11-23-1966   Condition Code 44 given:  Yes Patient signature on Condition Code 44 notice:  Yes Documentation of 2 MD's agreement:  Yes Code 44 added to claim:  Yes    Boneta Lucks, RN 02/27/2023, 9:18 AM

## 2023-02-27 NOTE — Plan of Care (Signed)
  Problem: Education: Goal: Understanding of cardiac disease, CV risk reduction, and recovery process will improve Outcome: Completed/Met Goal: Individualized Educational Video(s) Outcome: Completed/Met   Problem: Activity: Goal: Ability to tolerate increased activity will improve Outcome: Completed/Met   Problem: Cardiac: Goal: Ability to achieve and maintain adequate cardiovascular perfusion will improve Outcome: Completed/Met   Problem: Health Behavior/Discharge Planning: Goal: Ability to safely manage health-related needs after discharge will improve Outcome: Completed/Met   

## 2023-03-16 ENCOUNTER — Encounter (HOSPITAL_COMMUNITY): Payer: Self-pay

## 2023-03-16 ENCOUNTER — Emergency Department (HOSPITAL_COMMUNITY): Payer: 59

## 2023-03-16 ENCOUNTER — Emergency Department (HOSPITAL_COMMUNITY)
Admission: EM | Admit: 2023-03-16 | Discharge: 2023-03-16 | Disposition: A | Discharge status: Home / Self Care | Payer: 59 | Attending: Emergency Medicine | Admitting: Emergency Medicine

## 2023-03-16 ENCOUNTER — Other Ambulatory Visit: Payer: Self-pay

## 2023-03-16 DIAGNOSIS — Z7982 Long term (current) use of aspirin: Secondary | ICD-10-CM | POA: Diagnosis not present

## 2023-03-16 DIAGNOSIS — M546 Pain in thoracic spine: Secondary | ICD-10-CM | POA: Insufficient documentation

## 2023-03-16 DIAGNOSIS — I2510 Atherosclerotic heart disease of native coronary artery without angina pectoris: Secondary | ICD-10-CM | POA: Diagnosis not present

## 2023-03-16 DIAGNOSIS — I10 Essential (primary) hypertension: Secondary | ICD-10-CM | POA: Insufficient documentation

## 2023-03-16 DIAGNOSIS — Z79899 Other long term (current) drug therapy: Secondary | ICD-10-CM | POA: Diagnosis not present

## 2023-03-16 DIAGNOSIS — R0602 Shortness of breath: Secondary | ICD-10-CM | POA: Insufficient documentation

## 2023-03-16 DIAGNOSIS — R109 Unspecified abdominal pain: Secondary | ICD-10-CM

## 2023-03-16 HISTORY — DX: Obesity, unspecified: E66.9

## 2023-03-16 LAB — CBC WITH DIFFERENTIAL/PLATELET
Abs Immature Granulocytes: 0.07 10*3/uL (ref 0.00–0.07)
Basophils Absolute: 0.1 10*3/uL (ref 0.0–0.1)
Basophils Relative: 1 %
Eosinophils Absolute: 0.1 10*3/uL (ref 0.0–0.5)
Eosinophils Relative: 1 %
HCT: 38.5 % — ABNORMAL LOW (ref 39.0–52.0)
Hemoglobin: 13.2 g/dL (ref 13.0–17.0)
Immature Granulocytes: 1 %
Lymphocytes Relative: 37 %
Lymphs Abs: 3.1 10*3/uL (ref 0.7–4.0)
MCH: 32.6 pg (ref 26.0–34.0)
MCHC: 34.3 g/dL (ref 30.0–36.0)
MCV: 95.1 fL (ref 80.0–100.0)
Monocytes Absolute: 0.8 10*3/uL (ref 0.1–1.0)
Monocytes Relative: 9 %
Neutro Abs: 4.3 10*3/uL (ref 1.7–7.7)
Neutrophils Relative %: 51 %
Platelets: 204 10*3/uL (ref 150–400)
RBC: 4.05 MIL/uL — ABNORMAL LOW (ref 4.22–5.81)
RDW: 13.7 % (ref 11.5–15.5)
WBC: 8.4 10*3/uL (ref 4.0–10.5)
nRBC: 0 % (ref 0.0–0.2)

## 2023-03-16 LAB — COMPREHENSIVE METABOLIC PANEL
ALT: 40 U/L (ref 0–44)
AST: 31 U/L (ref 15–41)
Albumin: 4.2 g/dL (ref 3.5–5.0)
Alkaline Phosphatase: 95 U/L (ref 38–126)
Anion gap: 9 (ref 5–15)
BUN: 27 mg/dL — ABNORMAL HIGH (ref 6–20)
CO2: 27 mmol/L (ref 22–32)
Calcium: 9.4 mg/dL (ref 8.9–10.3)
Chloride: 101 mmol/L (ref 98–111)
Creatinine, Ser: 1.29 mg/dL — ABNORMAL HIGH (ref 0.61–1.24)
GFR, Estimated: >60 mL/min (ref >60)
Glucose, Bld: 100 mg/dL — ABNORMAL HIGH (ref 70–99)
Potassium: 3.8 mmol/L (ref 3.5–5.1)
Sodium: 137 mmol/L (ref 135–145)
Total Bilirubin: 0.6 mg/dL (ref 0.3–1.2)
Total Protein: 7.1 g/dL (ref 6.5–8.1)

## 2023-03-16 LAB — TROPONIN I (HIGH SENSITIVITY): Troponin I (High Sensitivity): 14 ng/L (ref <18)

## 2023-03-16 LAB — LIPASE, BLOOD: Lipase: 28 U/L (ref 11–51)

## 2023-03-16 MED ORDER — MELOXICAM 7.5 MG PO TABS: 7.5000 mg | ORAL_TABLET | Freq: Two times a day (BID) | ORAL | 0 refills | Status: AC | PRN

## 2023-03-16 MED ORDER — METHOCARBAMOL 500 MG PO TABS
1000.0000 mg | ORAL_TABLET | Freq: Once | ORAL | Status: AC
  Administered 2023-03-16: 1000 mg via ORAL
  Filled 2023-03-16: qty 2

## 2023-03-16 MED ORDER — TIZANIDINE HCL 4 MG PO TABS: 4.0000 mg | ORAL_TABLET | Freq: Four times a day (QID) | ORAL | 0 refills | Status: AC | PRN

## 2023-03-16 MED ORDER — DEXAMETHASONE SODIUM PHOSPHATE 10 MG/ML IJ SOLN
10.0000 mg | Freq: Once | INTRAMUSCULAR | Status: AC
  Administered 2023-03-16: 10 mg via INTRAMUSCULAR
  Filled 2023-03-16: qty 1

## 2023-03-16 NOTE — ED Provider Triage Note (Signed)
Emergency Medicine Provider Triage Evaluation Note  Chad Lester , a 57 y.o. male  was evaluated in triage.  Pt complains of shortness of breath.  Pt reports difficulty walking due to shortness of breath.    Review of Systems  Positive: short of breath, chest discomfort   Negative: fever  Physical Exam  BP (!) 143/96 (BP Location: Right Arm)   Pulse 88   Temp 98.5 F (36.9 C) (Oral)   Resp 19   Ht 5\' 11"  (1.803 m)   Wt (!) 158.8 kg   SpO2 97%   BMI 48.82 kg/m  Gen:   Awake, no distress   Resp:  Normal effort  MSK:   Moves extremities without difficulty  Other:    Medical Decision Making  Medically screening exam initiated at 6:24 PM.  Appropriate orders placed.  Chad Lester was informed that the remainder of the evaluation will be completed by another provider, this initial triage assessment does not replace that evaluation, and the importance of remaining in the ED until their evaluation is complete.     Fransico Meadow, [place] 03/16/23 1825

## 2023-03-16 NOTE — ED Provider Notes (Signed)
[facility] Provider Note   CSN: [id number] Arrival date & time: 03/16/23  1742     History  Chief Complaint  Patient presents with   Shortness of Breath    Chad Lester is a 57 y.o. male.   Shortness of Breath  This patient is a morbidly obese male, no history of coronary disease but does have a history of hypertension.  He also has a history of obesity for which she was treated with a lap band many years ago, it was subsequently removed.  He states that for the last month he has had some discomfort radiating from his back around to his abdomen on both sides, seems to get worse with any movement, even trying to change position in the bed causes the pain to get worse.  He feels a little short of breath when the pain occurs, when he is holding perfectly still and bracing himself with his arms the pain goes away.  He was actually admitted to the hospital on February 27, he was admitted to the hospital and had an echocardiogram completed on the 28th.  This showed a normal ejection fraction of 65 to 70%, normal left ventricular diastolic parameters.  He also had a visit on February 19 where he had a CT angiogram for evaluation of his symptoms which was negative  He continues to have the symptoms and is frustrated because the medicines he has been taking at home including anti-inflammatories tramadol and muscle relaxers are not seeming to help.  He reports he had similar symptoms in the past in his back which seem to help with a steroid shot    Home Medications Prior to Admission medications   Medication Sig Start Date End Date Taking? Authorizing Provider  acetaminophen (TYLENOL) 500 MG tablet Take 500 mg by mouth every 6 (six) hours as needed.    [provider]  amLODipine (NORVASC) 5 MG tablet Take 1 tablet by mouth daily. 05/20/22   [provider]  Aspirin-Salicylamide-Caffeine (ARTHRITIS STRENGTH BC POWDER PO) Take  2 packets by mouth as needed (pain).    [provider]  cyclobenzaprine (FLEXERIL) 10 MG tablet Take 10 mg by mouth as needed for muscle spasms.    [provider]  EPINEPHrine (EPIPEN 2-PAK) 0.3 mg/0.3 mL IJ SOAJ injection Take 1 auto by injection route as directed. Patient not taking: Reported on 02/25/2023 02/08/22   [provider]  gabapentin (NEURONTIN) 100 MG capsule Take 2 capsules by mouth 2 (two) times daily.    [provider]  HYDROcodone-acetaminophen (NORCO/VICODIN) 5-325 MG tablet Take 2 tablets by mouth every 4 (four) hours as needed. 02/17/23   Luvenia Heller, PA-C  ibuprofen (ADVIL) 200 MG tablet Take 4 tablets (800 mg total) by mouth every 6 (six) hours as needed for moderate pain. 02/26/23   Shahmehdi, Valeria Batman, MD  lurasidone (LATUDA) 20 MG TABS tablet Take 1 tablet by mouth daily.    [provider]  rosuvastatin (CRESTOR) 10 MG tablet Take 10 mg by mouth daily.    [provider]  venlafaxine XR (EFFEXOR-XR) 150 MG 24 hr capsule Take 1 capsule by mouth daily.    [provider]      Allergies    Bentyl [dicyclomine], Lisinopril, Penicillins, and Sulfa antibiotics    Review of Systems   Review of Systems  Respiratory:  Positive for shortness of breath.   All other systems reviewed and are negative.  Physical Exam Updated Vital Signs BP (!) 143/96 (BP Location: Right Arm)   Pulse 88   Temp 98.5 F (36.9 C) (Oral)   Resp 19   Ht 1.803 m (5\' 11" )   Wt (!) 158.8 kg   SpO2 97%   BMI 48.82 kg/m  Physical Exam Vitals and nursing note reviewed.  Constitutional:      General: He is not in acute distress.    Appearance: He is well-developed.  HENT:     Head: Normocephalic and atraumatic.     Mouth/Throat:     Pharynx: No oropharyngeal exudate.  Eyes:     General: No scleral icterus.       Right eye: No discharge.        Left eye: No discharge.     Conjunctiva/sclera: Conjunctivae normal.      Pupils: Pupils are equal, round, and reactive to light.  Neck:     Thyroid: No thyromegaly.     Vascular: No JVD.  Cardiovascular:     Rate and Rhythm: Normal rate and regular rhythm.     Heart sounds: Normal heart sounds. No murmur heard.    No friction rub. No gallop.  Pulmonary:     Effort: Pulmonary effort is normal. No respiratory distress.     Breath sounds: Normal breath sounds. No wheezing or rales.  Abdominal:     General: Bowel sounds are normal. There is no distension.     Palpations: Abdomen is soft. There is no mass.     Tenderness: There is no abdominal tenderness.  Musculoskeletal:        General: No tenderness. Normal range of motion.     Cervical back: Normal range of motion and neck supple.     Right lower leg: No tenderness. No edema.     Left lower leg: No tenderness. No edema.     Comments: Tenderness with range of motion of his abdomen and chest, this is not reproducible to palpation  Lymphadenopathy:     Cervical: No cervical adenopathy.  Skin:    General: Skin is warm and dry.     Findings: No erythema or rash.  Neurological:     General: No focal deficit present.     Mental Status: He is alert.     Coordination: Coordination normal.  Psychiatric:        Behavior: Behavior normal.     ED Results / Procedures / Treatments   Labs (all labs ordered are listed, but only abnormal results are displayed) Labs Reviewed  CBC WITH DIFFERENTIAL/PLATELET - Abnormal; Notable for the following components:      Result Value   RBC 4.05 (*)    HCT 38.5 (*)    All other components within normal limits  COMPREHENSIVE METABOLIC PANEL  LIPASE, BLOOD  TROPONIN I (HIGH SENSITIVITY)    EKG None  Radiology DG Chest Port 1 View  Result Date: 03/16/2023 CLINICAL DATA:  Shortness of breath EXAM: PORTABLE CHEST 1 VIEW COMPARISON:  02/25/2023 FINDINGS: Heart and mediastinal contours are within normal limits. No focal opacities or effusions. No acute bony abnormality.  Multiple old healed right rib fractures, unchanged. IMPRESSION: No active disease. Electronically Signed   By: Rolm Baptise M.D.   On: 03/16/2023 19:11    Procedures Procedures    Medications Ordered in ED Medications  dexamethasone (DECADRON) injection 10 mg (has no administration in time range)  methocarbamol (ROBAXIN) tablet 1,000 mg (has no administration in time range)    ED  Course/ Medical Decision Making/ A&P                             Medical Decision Making Risk Prescription drug management.   EKG is unremarkable, has had recent workup showing normal echocardiogram and negative troponins, vital signs reflect minimal hypertension.  I suspect that his pain is related to a musculoskeletal cause.  Will give shot of Decadron while awaiting the rest of his lab work.  Cardiac monitoring: Normal sinus rhythm and my interpretation  EKG: Normal sinus rhythm, no ischemia  Labs: Unremarkable, negative lipase metabolic panel and CBC.  Course: Patient given steroid shot  Medication management: Overall patient is not significantly better but I have a low index of suspicion that this is something pathological.  Vitals are normal, given a local list for follow-up, I have reviewed the patient's chart at length including multiple recent visits, he is appearing to be stable for discharge at this time.          Final Clinical Impression(s) / ED Diagnoses Final diagnoses:  None    Rx / DC Orders ED Discharge Orders     None         Noemi Chapel, MD 03/16/23 2003

## 2023-03-16 NOTE — ED Triage Notes (Signed)
Pt complaining of pain around his abdomen/ribs that started over a month ago. For the past 3 days it feels like it is tearing and tight right around the bottom of his ribcage.

## 2023-03-16 NOTE — Discharge Instructions (Signed)
The listed phone numbers below for family doctors locally.  You may switch her muscle relaxer from Flexeril over to tizanidine.  Additionally you have been given a steroid shot, this should work for the next several days and continue to help.  He may take Mobic twice a day as needed for pain  [facility] Doctor List    Tula Nakayama, MD. Specialty: [facility] Contact information: [address] [place] [address]  [phone]   Sallee Lange, MD. Specialty: [facility] Medicine Contact information: [place]  [place] [address]  [phone]   Rosita Fire, MD Specialty: Internal Medicine Contact information: [place] [address]  [phone]   Delphina Cahill, MD. Specialty: Internal Medicine Contact information: [place] [address]  [phone]    [facility] (Dr. Maudie Mercury) Specialty: Family Medicine Contact information: [place] [address]  [phone]   Leslie Andrea, MD. Specialty: [facility] Medicine Contact information: [place] [address]  [phone]   Asencion Noble, MD. Specialty: Internal Medicine Contact information: [address]  [place] [address]  [place]  [address] [place], [place] [address] [phone]  Services [facility] offers a variety of basic health services.  Services include but are not limited to: Blood pressure checks  Heart rate checks  Blood sugar checks  Urine analysis  Rapid strep tests  Pregnancy tests.  Health education and referrals  People needing more complex services will be directed to a physician online. Using these virtual visits, doctors can evaluate and prescribe medicine and treatments. There will be no medication on-site, though [facility] will help  patients fill their prescriptions at little to no cost.   For More information please go to: [email or web address]   Thank you for allowing [place] to treat you in the emergency department today.  After reviewing your examination and potential testing that was done it appears that you are safe to go home.  I would like for you to follow-up with your doctor within the next several days, have them obtain your results and follow-up with them to review all of these tests.  If you should develop severe or worsening symptoms return to the emergency department immediately

## 2023-03-20 ENCOUNTER — Ambulatory Visit: Payer: 59 | Admitting: Cardiology

## 2023-03-31 ENCOUNTER — Ambulatory Visit: Payer: 59 | Admitting: Nurse Practitioner

## 2023-03-31 NOTE — Progress Notes (Deleted)
Office Visit    Patient Name: Chad Lester Date of Encounter: 03/31/2023  PCP:  Kathyrn Lass   [place]  Cardiologist:  None *** Advanced Practice Provider:  No care team member to display Electrophysiologist:  None  {Press F2 to show EP APP, CHF, sleep or structural heart MD               :[id number]  { Click here to update then REFRESH NOTE - MD (PCP) or APP (Team Member)  Change PCP Type for MD, Specialty for APP is either Cardiology or Clinical Cardiac Electrophysiology  :[id number]  Chief Complaint    Chad Lester is a 57 y.o. male with a hx of HLD, HTN, obesity, PTSD, previous atrial fibrillation, prior history of DVT, and mood disorder presents today for outpatient evaluation.    Past Medical History    Past Medical History:  Diagnosis Date   High cholesterol    Hypertension    Obesity    PTSD (post-traumatic stress disorder)    No past surgical history on file.  Allergies  Allergies  Allergen Reactions   Bentyl [Dicyclomine]    Lisinopril    Penicillins    Sulfa Antibiotics     History of Present Illness    Chad Lester is a 57 y.o. male with a hx of *** last seen ***.  Presented to the ED in February 2024 with complaint of chest pain.  Was found to be carrying a heavy load prior to this.  Did state exertion made the chest pain worse.  Pain was reported to take his breath away.  Reported he had not been in A-fib for many years, had an ablation.  At the time, he was not seeing a cardiologist.  Previous CTA of chest negative for PE.  Reported chronic DOE for about a year.  In the ED, patient reported smoking marijuana and vaping, denied any alcohol use.  ED workup troponins-low and flat.  EKG showed questionable new Q waves.  Echocardiogram was unremarkable.  Was cleared by cardiology to be discharged home.  Today he presents for outpatient follow-up.  He states  EKGs/Labs/Other Studies Reviewed:   The following  studies were reviewed today: ***  EKG:  EKG is *** ordered today.  The ekg ordered today demonstrates ***  Recent Labs: 02/25/2023: B Natriuretic Peptide 19.0 02/26/2023: Magnesium 1.9 03/16/2023: ALT 40; BUN 27; Creatinine, Ser 1.29; Hemoglobin 13.2; Platelets 204; Potassium 3.8; Sodium 137  Recent Lipid Panel    Component Value Date/Time   CHOL 163 02/26/2023 0420   TRIG 94 02/26/2023 0420   HDL 52 02/26/2023 0420   CHOLHDL 3.1 02/26/2023 0420   VLDL 19 02/26/2023 0420   LDLCALC 92 02/26/2023 0420    Risk Assessment/Calculations:  {Does this patient have ATRIAL FIBRILLATION?:[phone]}  Home Medications   No outpatient medications have been marked as taking for the 03/31/23 encounter (Appointment) with Finis Bud, NP.     Review of Systems   ***   All other systems reviewed and are otherwise negative except as noted above.  Physical Exam    VS:  There were no vitals taken for this visit. , BMI There is no height or weight on file to calculate BMI.  Wt Readings from Last 3 Encounters:  03/16/23 (!) 350 lb (158.8 kg)  02/25/23 (!) 351 lb 13.7 oz (159.6 kg)  02/17/23 (!) 340 lb (154.2 kg)     GEN: Well nourished, well developed, in  no acute distress. HEENT: normal. Neck: Supple, no JVD, carotid bruits, or masses. Cardiac: ***RRR, no murmurs, rubs, or gallops. No clubbing, cyanosis, edema.  ***Radials/PT 2+ and equal bilaterally.  Respiratory:  ***Respirations regular and unlabored, clear to auscultation bilaterally. GI: Soft, nontender, nondistended. MS: No deformity or atrophy. Skin: Warm and dry, no rash. Neuro:  Strength and sensation are intact. Psych: Normal affect.  Assessment & Plan    ***  {Are you ordering a CV Procedure (e.g. stress test, cath, DCCV, TEE, etc)?   Press F2        :[id number]      Disposition: Follow up {follow up:15908} with None or APP.  Signed, Finis Bud, NP 03/31/2023, 8:23 AM [place]

## 2023-04-02 ENCOUNTER — Encounter: Payer: Self-pay | Admitting: Nurse Practitioner

## 2023-07-31 DEATH — deceased
